# Patient Record
Sex: Female | Born: 1995 | Race: Black or African American | Hispanic: No | Marital: Single | Smoking: Never smoker
Health system: Southern US, Community
[De-identification: ages and names within clinical notes are randomized; demographics above are authoritative.]

## PROBLEM LIST (undated history)

## (undated) ENCOUNTER — Ambulatory Visit: Source: Home / Self Care

## (undated) DIAGNOSIS — J45909 Unspecified asthma, uncomplicated: Secondary | ICD-10-CM

## (undated) HISTORY — PX: HERNIA REPAIR: SHX51

---
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
## 2017-01-04 ENCOUNTER — Ambulatory Visit (HOSPITAL_COMMUNITY)
Admission: EM | Admit: 2017-01-04 | Discharge: 2017-01-04 | Disposition: A | Discharge status: Home / Self Care | Payer: Self-pay | Attending: Internal Medicine | Admitting: Internal Medicine

## 2017-01-04 ENCOUNTER — Encounter (HOSPITAL_COMMUNITY): Payer: Self-pay | Admitting: Emergency Medicine

## 2017-01-04 DIAGNOSIS — S025XXA Fracture of tooth (traumatic), initial encounter for closed fracture: Secondary | ICD-10-CM

## 2017-01-04 DIAGNOSIS — K047 Periapical abscess without sinus: Secondary | ICD-10-CM

## 2017-01-04 MED ORDER — PENICILLIN V POTASSIUM 500 MG PO TABS: 500.0000 mg | ORAL_TABLET | Freq: Two times a day (BID) | ORAL | 0 refills | Status: DC

## 2017-01-04 MED ORDER — HYDROCODONE-ACETAMINOPHEN 5-325 MG PO TABS: 1.0000 | ORAL_TABLET | Freq: Four times a day (QID) | ORAL | 0 refills | Status: DC | PRN

## 2017-01-04 MED ORDER — NAPROXEN 500 MG PO TABS: 500.0000 mg | ORAL_TABLET | Freq: Two times a day (BID) | ORAL | 0 refills | Status: DC

## 2017-01-04 MED ORDER — KETOROLAC TROMETHAMINE 60 MG/2ML IM SOLN
INTRAMUSCULAR | Status: AC
  Filled 2017-01-04: qty 2

## 2017-01-04 MED ORDER — KETOROLAC TROMETHAMINE 60 MG/2ML IM SOLN
60.0000 mg | Freq: Once | INTRAMUSCULAR | Status: AC
  Administered 2017-01-04: 60 mg via INTRAMUSCULAR

## 2017-01-04 NOTE — ED Provider Notes (Signed)
[facility]    CSN: [id number] Arrival date & time: 01/04/17  1406     History   Chief Complaint Chief Complaint  Patient presents with  . Dental Pain    HPI Barbara King is a 21 y.o. female.  She presents today with 2-3 day history of pain and swelling and a broken tooth in the left lower jaw. This is becoming increasingly severe over the last couple days. Tooth was known to be broken, but had not been inflamed previously. No fever, no malaise. Also has some difficulty with late day headaches most days, this is a chronic difficulty.    HPI  Past Medical History:  Diagnosis Date  . Asthma      Past Surgical History:  Procedure Laterality Date  . HERNIA REPAIR       Home Medications    Prior to Admission medications   Medication Sig Start Date End Date Taking? Authorizing Provider  albuterol (PROVENTIL HFA;VENTOLIN HFA) 108 (90 Base) MCG/ACT inhaler Inhale into the lungs every 6 (six) hours as needed for wheezing or shortness of breath.   Yes [provider]  acetaminophen (TYLENOL) 325 MG tablet Take 2 tablets (650 mg total) by mouth every 6 (six) hours as needed for mild pain. 01/29/15   Marcellina Millin, MD    Family History History reviewed. No pertinent family history.  Social History Social History  Substance Use Topics  . Smoking status: Never Smoker  . Smokeless tobacco: Never Used  . Alcohol use No     Allergies   Patient has no known allergies.   Review of Systems Review of Systems  All other systems reviewed and are negative.    Physical Exam Triage Vital Signs ED Triage Vitals  Enc Vitals Group     BP 01/04/17 1425 113/68     Pulse Rate 01/04/17 1425 96     Resp 01/04/17 1425 20     Temp 01/04/17 1425 98.9 F (37.2 C)     Temp Source 01/04/17 1425 Oral     SpO2 01/04/17 1425 100 %     Weight --      Height --      Pain Score 01/04/17 1424 8     Pain Loc --    Updated Vital Signs BP 113/68 (BP Location:  Right Arm)   Pulse 96   Temp 98.9 F (37.2 C) (Oral)   Resp 20   LMP 12/11/2016   SpO2 100%   Physical Exam  Constitutional: She is oriented to person, place, and time. No distress.  HENT:  Head: Atraumatic.  No face or jaw swelling visible externally Gum swelling and tenderness around the left lower second molar, which is broken No trismus  Eyes:  Conjugate gaze observed, no eye redness/discharge  Neck: Neck supple.  Cardiovascular: Normal rate.   Pulmonary/Chest: No respiratory distress.  Abdominal: She exhibits no distension.  Musculoskeletal: Normal range of motion.  Neurological: She is alert and oriented to person, place, and time.  Skin: Skin is warm and dry.  Nursing note and vitals reviewed.    UC Treatments / Results   Procedures Procedures (including critical care time)  Medications Ordered in UC Medications  ketorolac (TORADOL) injection 60 mg (60 mg Intramuscular Given 01/04/17 1504)    Final Clinical Impressions(s) / UC Diagnoses   Final diagnoses:  Dental infection  Closed broken tooth with complication, initial encounter   Followup with dentist as planned on Monday 5/21.  Injection of  ketorolac (anti inflammatory/pain reliever) given at urgent care today for pain.  Prescriptions for antibiotic (penicillin V) and naproxen (for pain) sent to the pharmacy.  Prescription for a small number of vicodin printed, for very severe pain.    Meds ordered this encounter  Medications  . HYDROcodone-acetaminophen (NORCO/VICODIN) 5-325 MG tablet    Sig: Take 1 tablet by mouth 4 (four) times daily as needed.    Dispense:  10 tablet    Refill:  0  . naproxen (NAPROSYN) 500 MG tablet    Sig: Take 1 tablet (500 mg total) by mouth 2 (two) times daily.    Dispense:  30 tablet    Refill:  0  . penicillin v potassium (VEETID) 500 MG tablet    Sig: Take 1 tablet (500 mg total) by mouth 2 (two) times daily.    Dispense:  20 tablet    Refill:  0      Eustace MooreMurray, [place]  W, MD 01/04/17 2142

## 2017-01-04 NOTE — Discharge Instructions (Addendum)
Followup with dentist as planned on Monday 5/21.  Injection of ketorolac (anti inflammatory/pain reliever) given at urgent care today for pain.  Prescriptions for antibiotic (penicillin V) and naproxen (for pain) sent to the pharmacy.  Prescription for a small number of vicodin printed, for very severe pain.

## 2017-01-04 NOTE — ED Triage Notes (Signed)
Pt c/o lower left dental pain onset 2-3 associated w/facial swelling, HA  Denies fevers, chills  Has appt w/dentist on 5/21  A&O x4... NAD.... Ambulatory

## 2019-03-03 ENCOUNTER — Encounter (HOSPITAL_COMMUNITY): Payer: Self-pay

## 2019-03-03 ENCOUNTER — Other Ambulatory Visit: Payer: Self-pay

## 2019-03-03 ENCOUNTER — Ambulatory Visit (HOSPITAL_COMMUNITY)
Admission: EM | Admit: 2019-03-03 | Discharge: 2019-03-03 | Disposition: A | Discharge status: Home / Self Care | Payer: Self-pay | Attending: Family Medicine | Admitting: Family Medicine

## 2019-03-03 DIAGNOSIS — N898 Other specified noninflammatory disorders of vagina: Secondary | ICD-10-CM | POA: Insufficient documentation

## 2019-03-03 LAB — POCT URINALYSIS DIP (DEVICE)
Glucose, UA: NEGATIVE mg/dL
Hgb urine dipstick: NEGATIVE
Ketones, ur: NEGATIVE mg/dL
Leukocytes,Ua: NEGATIVE
Nitrite: NEGATIVE
Protein, ur: NEGATIVE mg/dL
Specific Gravity, Urine: 1.025 (ref 1.005–1.030)
Urobilinogen, UA: 0.2 mg/dL (ref 0.0–1.0)
pH: 6.5 (ref 5.0–8.0)

## 2019-03-03 LAB — POCT PREGNANCY, URINE: Preg Test, Ur: NEGATIVE

## 2019-03-03 MED ORDER — METRONIDAZOLE 500 MG PO TABS: 500.0000 mg | ORAL_TABLET | Freq: Two times a day (BID) | ORAL | 0 refills | Status: DC

## 2019-03-03 MED ORDER — FLUCONAZOLE 150 MG PO TABS: 150.0000 mg | ORAL_TABLET | Freq: Every day | ORAL | 0 refills | Status: DC

## 2019-03-03 NOTE — Discharge Instructions (Addendum)
We are treating you for BV and trichomonas. Also treating for possible yeast Sending your swab for testing We will call you with any positive results. Refrain from sexual activity until known  results

## 2019-03-03 NOTE — ED Triage Notes (Signed)
Pt cc she has a vaginal discharge x 2 weeks. Pt states its yellowish and white.

## 2019-03-03 NOTE — ED Provider Notes (Signed)
[facility]    CSN: [id number] Arrival date & time: 03/03/19  0813     History   Chief Complaint Chief Complaint  Patient presents with  . Vaginal Discharge    HPI Barbara King is a 23 y.o. female.   Patient is a 23 year old female that presents today with approximately 2 weeks of vaginal discharge.  She is reporting the discharge is yellowish and white.  Some dysuria and irritation in the vaginal area more after intercourse.  Symptoms have been constant.  She did a test kit that she bought from [facility] that revealed results of possible bacterial vaginosis or trichomonas.  She is currently sexually active with one partner, unprotected.  Denies any associate abdominal pain, back pain, pelvic pain, fevers, hematuria or urinary frequency.  Denies any vaginal bleeding. Patient's last menstrual period was 02/03/2019.  ROS per HPI      Past Medical History:  Diagnosis Date  . Asthma     There are no active problems to display for this patient.   Past Surgical History:  Procedure Laterality Date  . HERNIA REPAIR      OB History   No obstetric history on file.      Home Medications    Prior to Admission medications   Medication Sig Start Date End Date Taking? Authorizing Provider  albuterol (PROVENTIL HFA;VENTOLIN HFA) 108 (90 Base) MCG/ACT inhaler Inhale into the lungs every 6 (six) hours as needed for wheezing or shortness of breath.    [provider]  fluconazole (DIFLUCAN) 150 MG tablet Take 1 tablet (150 mg total) by mouth daily. 03/03/19   Loura Halt A, NP  metroNIDAZOLE (FLAGYL) 500 MG tablet Take 1 tablet (500 mg total) by mouth 2 (two) times daily. 03/03/19   Orvan July, NP    Family History History reviewed. No pertinent family history.  Social History Social History   Tobacco Use  . Smoking status: Never Smoker  . Smokeless tobacco: Never Used  Substance Use Topics  . Alcohol use: No  . Drug use: No     Allergies    Patient has no known allergies.   Review of Systems Review of Systems   Physical Exam Triage Vital Signs ED Triage Vitals  Enc Vitals Group     BP 03/03/19 0837 107/80     Pulse --      Resp 03/03/19 0837 16     Temp 03/03/19 0837 97.8 F (36.6 C)     Temp Source 03/03/19 0837 Oral     SpO2 03/03/19 0837 100 %     Weight 03/03/19 0835 170 lb (77.1 kg)     Height --      Head Circumference --      Peak Flow --      Pain Score 03/03/19 0835 0     Pain Loc --      Pain Edu? --      Excl. in [place]? --    No data found.  Updated Vital Signs BP 107/80 (BP Location: Right Arm)   Temp 97.8 F (36.6 C) (Oral)   Resp 16   Wt 170 lb (77.1 kg)   LMP 02/03/2019   SpO2 100%   Visual Acuity Right Eye Distance:   Left Eye Distance:   Bilateral Distance:    Right Eye Near:   Left Eye Near:    Bilateral Near:     Physical Exam Vitals signs and nursing note reviewed.  Constitutional:  General: She is not in acute distress.    Appearance: Normal appearance. She is not ill-appearing, toxic-appearing or diaphoretic.  HENT:     Head: Normocephalic.     Nose: Nose normal.     Mouth/Throat:     Pharynx: Oropharynx is clear.  Eyes:     Conjunctiva/sclera: Conjunctivae normal.  Neck:     Musculoskeletal: Normal range of motion.  Pulmonary:     Effort: Pulmonary effort is normal.  Abdominal:     Palpations: Abdomen is soft.     Tenderness: There is no abdominal tenderness.  Musculoskeletal: Normal range of motion.  Skin:    General: Skin is warm and dry.     Findings: No rash.  Neurological:     Mental Status: She is alert.  Psychiatric:        Mood and Affect: Mood normal.      UC Treatments / Results  Labs (all labs ordered are listed, but only abnormal results are displayed) Labs Reviewed  POCT URINALYSIS DIP (DEVICE) - Abnormal; Notable for the following components:      Result Value   Bilirubin Urine SMALL (*)    All other components within normal  limits  POC URINE PREG, ED  POCT PREGNANCY, URINE  CERVICOVAGINAL ANCILLARY ONLY    EKG   Radiology No results found.  Procedures Procedures (including critical care time)  Medications Ordered in UC Medications - No data to display  Initial Impression / Assessment and Plan / UC Course  I have reviewed the triage vital signs and the nursing notes.  Pertinent labs & imaging results that were available during my care of the patient were reviewed by me and considered in my medical decision making (see chart for details).     Urine negative for infection or pregnancy. Sending self swab for testing Treating prophylactically for BV, yeast and trichomonas. Recommend refrain from sexual activity for at least 7 days until we get results and ensure the infection is cleared Final Clinical Impressions(s) / UC Diagnoses   Final diagnoses:  Vaginal discharge     Discharge Instructions     We are treating you for BV and trichomonas. Also treating for possible yeast Sending your swab for testing We will call you with any positive results. Refrain from sexual activity until known  results    ED Prescriptions    Medication Sig Dispense Auth. Provider   metroNIDAZOLE (FLAGYL) 500 MG tablet Take 1 tablet (500 mg total) by mouth 2 (two) times daily. 14 tablet Ceara Wrightson A, NP   fluconazole (DIFLUCAN) 150 MG tablet Take 1 tablet (150 mg total) by mouth daily. 2 tablet Loura Halt A, NP     Controlled Substance Prescriptions [place] Controlled Substance Registry consulted? Not Applicable   Orvan July, NP 03/03/19 0945

## 2019-03-04 LAB — CERVICOVAGINAL ANCILLARY ONLY
Bacterial vaginitis: POSITIVE — AB
Candida vaginitis: POSITIVE — AB
Chlamydia: NEGATIVE
Neisseria Gonorrhea: NEGATIVE
Trichomonas: NEGATIVE

## 2019-05-16 ENCOUNTER — Emergency Department (HOSPITAL_COMMUNITY)
Admission: EM | Admit: 2019-05-16 | Discharge: 2019-05-16 | Disposition: A | Discharge status: Home / Self Care | Payer: Self-pay | Attending: Emergency Medicine | Admitting: Emergency Medicine

## 2019-05-16 ENCOUNTER — Other Ambulatory Visit: Payer: Self-pay

## 2019-05-16 ENCOUNTER — Encounter (HOSPITAL_COMMUNITY): Payer: Self-pay | Admitting: Emergency Medicine

## 2019-05-16 DIAGNOSIS — Z5321 Procedure and treatment not carried out due to patient leaving prior to being seen by health care provider: Secondary | ICD-10-CM | POA: Insufficient documentation

## 2019-05-16 DIAGNOSIS — H9202 Otalgia, left ear: Secondary | ICD-10-CM | POA: Insufficient documentation

## 2019-05-16 NOTE — ED Notes (Signed)
Patient states she wants to leave because the wait time is too long. States she is going to go to urgent care in morning.

## 2019-05-16 NOTE — ED Triage Notes (Signed)
Pt c/o L sided ear pain, R sided HA with shooting pain when sneezing and increased pressure when she bends over while working.

## 2020-07-06 ENCOUNTER — Other Ambulatory Visit: Payer: Self-pay

## 2020-07-06 ENCOUNTER — Other Ambulatory Visit: Payer: Self-pay | Admitting: Family Medicine

## 2020-07-06 ENCOUNTER — Ambulatory Visit: Payer: Self-pay

## 2020-07-06 DIAGNOSIS — Z0000 Encounter for general adult medical examination without abnormal findings: Secondary | ICD-10-CM

## 2021-05-30 ENCOUNTER — Encounter: Payer: 59 | Attending: Obstetrics and Gynecology | Admitting: Registered"

## 2021-05-30 ENCOUNTER — Ambulatory Visit: Payer: 59 | Admitting: Registered"

## 2021-05-30 ENCOUNTER — Other Ambulatory Visit: Payer: Self-pay

## 2021-05-30 DIAGNOSIS — R7309 Other abnormal glucose: Secondary | ICD-10-CM | POA: Diagnosis present

## 2021-05-31 ENCOUNTER — Ambulatory Visit: Payer: 59 | Admitting: Registered"

## 2021-06-01 ENCOUNTER — Ambulatory Visit: Payer: 59 | Admitting: Registered"

## 2021-06-01 ENCOUNTER — Encounter: Payer: Self-pay | Admitting: Registered"

## 2021-06-01 NOTE — Progress Notes (Signed)
Patient was seen for (elevated A1c in pregnancy) self-management on 05/30/2021  Start time 0915 and End time 1015   Estimated due date: 01/01/2021; [redacted]w[redacted]d  Clinical: Medications: none Medical History: reviewed Labs: OGTT n/a, A1c 5.7%   Dietary and Lifestyle History: Patient states recently she has been able to get better sleep since she stopped working. Pt states prior to that she was working 12 hr days. Pt states she had an active job but now that she is not working does not get much activity and feels tired so not motivated to exercise.  Pt states she has stopped drinking coffee ([employer]bucks), sweet tea and soda.  Physical Activity: ADL Stress: 2/10 Sleep: 6-8 hrs, restless  24 hr Recall: First Meal: eggs, corn beef hash OR oatmeal, strawberries and cream OR mcdonalds sausage, egg, cheese biscuit, hasbrowns OR cereal Snack: Second meal: [employer] chicken nuggests 10 piece, medium fries Snack: Third meal:none Snacks throughout the day may include chewy granola bars, poptarts Beverages: water, OM breakfast   NUTRITION INTERVENTION  Nutrition education (E-1) on the following topics:   Initial Follow-up  [x]  []  Definition of Gestational Diabetes   ** and reasons for testing prior to 3rd trimester [x]  []  Why dietary management is important in controlling blood glucose []  []  Effects each nutrient has on blood glucose levels []  []  Simple carbohydrates vs complex carbohydrates []  []  Fluid intake []  []  Creating a balanced meal plan []  []  Carbohydrate counting  [x]  []  When to check blood glucose levels [x]  []  Proper blood glucose monitoring techniques [x]  []  Effect of stress and stress reduction techniques  [x]  []  Exercise effect on blood glucose levels, appropriate exercise during pregnancy []  []  Importance of limiting caffeine and abstaining from alcohol and smoking []  []  Medications used for blood sugar control during pregnancy []  []  Hypoglycemia and rule of  15 []  []  Postpartum self care  Blood glucose monitor given: Accu-chek Guide Me Lot Exp: 06/27/2022 CBG: 102 mg/dL  Patient instructed to monitor glucose levels: FBS: 60 - ? 95 mg/dL (some clinics use 90 for cutoff) 1 hour: ? 140 mg/dL 2 hour: ? mg/dL  Patient received handouts: Nutrition Diabetes and Pregnancy Carbohydrate Counting List  Patient will be seen for follow-up as needed.

## 2021-06-01 NOTE — Progress Notes (Signed)
Patient was seen for Gestational Diabetes self-management on 05/30/2021  Start time 0915 and End time 1015   Patient was seen at [facility] for women location, but appointment created in the Nutrition [facility] Diabetes [facility] schedule.   Visit Notes and letter to referring provider in [facility] schedule

## 2021-06-14 ENCOUNTER — Encounter: Payer: Self-pay | Admitting: Obstetrics and Gynecology

## 2021-07-09 ENCOUNTER — Encounter: Payer: 59 | Attending: Obstetrics and Gynecology | Admitting: Registered"

## 2021-07-09 DIAGNOSIS — R7309 Other abnormal glucose: Secondary | ICD-10-CM | POA: Insufficient documentation

## 2021-08-19 NOTE — L&D Delivery Note (Signed)
Delivery Note ?I was called to stand by for this delivery as the on call attending was not immediately available.  On evaluation, patient was noted to be pushing and fetal head was crowning. ? ?At 11:58 AM a viable female was delivered via Vaginal, Spontaneous (Presentation: LOA).  APGAR: 9, 9; weight pending.  ?Placenta status: Spontaneous, Intact.  Cord:  3VC  with the following complications: Tight nuchal cord x 1, loose body cord x 1. ? ?Anesthesia:  Epidural ?Episiotomy:  Not done ?Lacerations: Bilateral superficial lacerations of labia minora re-approximated with interrupted stitches of 3-0 Chromic ?Est. Blood Loss (mL):  75 ml ? ?Mom to postpartum.  Baby to Couplet care / Skin to Skin. ? ? ?Jaynie Collins, MD ?12/21/2021, 12:23 PM ?

## 2021-09-13 ENCOUNTER — Inpatient Hospital Stay (HOSPITAL_COMMUNITY)
Admission: AD | Admit: 2021-09-13 | Discharge: 2021-09-13 | Disposition: A | Discharge status: Home / Self Care | Payer: 59 | Attending: Obstetrics & Gynecology | Admitting: Obstetrics & Gynecology

## 2021-09-13 ENCOUNTER — Encounter (HOSPITAL_COMMUNITY): Payer: Self-pay

## 2021-09-13 DIAGNOSIS — Z0371 Encounter for suspected problem with amniotic cavity and membrane ruled out: Secondary | ICD-10-CM | POA: Diagnosis not present

## 2021-09-13 DIAGNOSIS — Z3689 Encounter for other specified antenatal screening: Secondary | ICD-10-CM | POA: Insufficient documentation

## 2021-09-13 DIAGNOSIS — N898 Other specified noninflammatory disorders of vagina: Secondary | ICD-10-CM | POA: Insufficient documentation

## 2021-09-13 DIAGNOSIS — O368120 Decreased fetal movements, second trimester, not applicable or unspecified: Secondary | ICD-10-CM | POA: Insufficient documentation

## 2021-09-13 DIAGNOSIS — Z3A24 24 weeks gestation of pregnancy: Secondary | ICD-10-CM | POA: Diagnosis not present

## 2021-09-13 DIAGNOSIS — O26892 Other specified pregnancy related conditions, second trimester: Secondary | ICD-10-CM | POA: Insufficient documentation

## 2021-09-13 LAB — WET PREP, GENITAL
Clue Cells Wet Prep HPF POC: NONE SEEN
Sperm: NONE SEEN
Trich, Wet Prep: NONE SEEN
WBC, Wet Prep HPF POC: >=10 — AB (ref <10)
Yeast Wet Prep HPF POC: NONE SEEN

## 2021-09-13 LAB — URINALYSIS, ROUTINE W REFLEX MICROSCOPIC
Bilirubin Urine: NEGATIVE
Glucose, UA: NEGATIVE mg/dL
Hgb urine dipstick: NEGATIVE
Ketones, ur: NEGATIVE mg/dL
Leukocytes,Ua: NEGATIVE
Nitrite: NEGATIVE
Protein, ur: NEGATIVE mg/dL
Specific Gravity, Urine: 1.005 (ref 1.005–1.030)
pH: 7 (ref 5.0–8.0)

## 2021-09-13 LAB — AMNISURE RUPTURE OF MEMBRANE (ROM) NOT AT ARMC: Amnisure ROM: NEGATIVE

## 2021-09-13 LAB — POCT FERN TEST: POCT Fern Test: NEGATIVE

## 2021-09-13 NOTE — Discharge Instructions (Signed)

## 2021-09-13 NOTE — MAU Provider Note (Signed)
History     CSN: [id number]  Arrival date and time: 09/13/21 1408   Event Date/Time   First Provider Initiated Contact with Patient 09/13/21 1438      Chief Complaint  Patient presents with   Decreased Fetal Movement   Rupture of Membranes   HPI Barbara King is a 26 y.o. G1P0 at [redacted]w[redacted]d who presents with decreased fetal movement. She states the baby wasn't moving as much this morning so she tried to listen with her doppler but wasn't able to find the heartbeat and she got scared. She also reports some leaking of fluid while she was at work and is unsure if it's urine. She denies any pain.  OB History     Gravida  1   Para      Term      Preterm      AB      Living         SAB      IAB      Ectopic      Multiple      Live Births              Past Medical History:  Diagnosis Date   Asthma     Past Surgical History:  Procedure Laterality Date   HERNIA REPAIR      History reviewed. No pertinent family history.  Social History   Tobacco Use   Smoking status: Never   Smokeless tobacco: Never  Substance Use Topics   Alcohol use: No   Drug use: No    Allergies: No Known Allergies  Medications Prior to Admission  Medication Sig Dispense Refill Last Dose   albuterol (PROVENTIL HFA;VENTOLIN HFA) 108 (90 Base) MCG/ACT inhaler Inhale into the lungs every 6 (six) hours as needed for wheezing or shortness of breath.   Past Week   Prenatal Vit-Fe Fumarate-FA (PRENATAL MULTIVITAMIN) TABS tablet Take 1 tablet by mouth daily at 12 noon.   09/13/2021   fluconazole (DIFLUCAN) 150 MG tablet Take 1 tablet (150 mg total) by mouth daily. 2 tablet 0    metroNIDAZOLE (FLAGYL) 500 MG tablet Take 1 tablet (500 mg total) by mouth 2 (two) times daily. 14 tablet 0     Review of Systems  Constitutional: Negative.  Negative for fatigue and fever.  HENT: Negative.    Respiratory: Negative.  Negative for shortness of breath.   Cardiovascular: Negative.  Negative for  chest pain.  Gastrointestinal: Negative.  Negative for abdominal pain, constipation, diarrhea, nausea and vomiting.  Genitourinary:  Positive for vaginal discharge. Negative for dysuria and vaginal bleeding.  Neurological: Negative.  Negative for dizziness and headaches.  Physical Exam   Blood pressure 119/77, temperature 98.6 F (37 C), temperature source Oral, resp. rate 15, SpO2 98 %.  Physical Exam Vitals and nursing note reviewed.  Constitutional:      General: She is not in acute distress.    Appearance: She is well-developed.  HENT:     Head: Normocephalic.  Eyes:     Pupils: Pupils are equal, round, and reactive to light.  Cardiovascular:     Rate and Rhythm: Normal rate and regular rhythm.     Heart sounds: Normal heart sounds.  Pulmonary:     Effort: Pulmonary effort is normal. No respiratory distress.     Breath sounds: Normal breath sounds.  Abdominal:     General: Bowel sounds are normal. There is no distension.     Palpations: Abdomen is soft.  Tenderness: There is no abdominal tenderness.  Genitourinary:    Comments: Pelvic exam: Cervix pink, visually closed, without lesion, scant white creamy discharge, vaginal walls and external genitalia normal   Skin:    General: Skin is warm and dry.  Neurological:     Mental Status: She is alert and oriented to person, place, and time.  Psychiatric:        Mood and Affect: Mood normal.        Behavior: Behavior normal.        Thought Content: Thought content normal.        Judgment: Judgment normal.   Fetal Tracing:  Baseline: 140 Variability: moderate Accels: 10x10 Decels: none  Toco: none   MAU Course  Procedures Results for orders placed or performed during the hospital encounter of 09/13/21 (from the past 24 hour(s))  Urinalysis, Routine w reflex microscopic Urine, Clean Catch     Status: Abnormal   Collection Time: 09/13/21  2:42 PM  Result Value Ref Range   Color, Urine YELLOW YELLOW    APPearance HAZY (A) CLEAR   Specific Gravity, Urine 1.005 1.005 - 1.030   pH 7.0 5.0 - 8.0   Glucose, UA NEGATIVE NEGATIVE mg/dL   Hgb urine dipstick NEGATIVE NEGATIVE   Bilirubin Urine NEGATIVE NEGATIVE   Ketones, ur NEGATIVE NEGATIVE mg/dL   Protein, ur NEGATIVE NEGATIVE mg/dL   Nitrite NEGATIVE NEGATIVE   Leukocytes,Ua NEGATIVE NEGATIVE  Wet prep, genital     Status: Abnormal   Collection Time: 09/13/21  2:58 PM  Result Value Ref Range   Yeast Wet Prep HPF POC NONE SEEN NONE SEEN   Trich, Wet Prep NONE SEEN NONE SEEN   Clue Cells Wet Prep HPF POC NONE SEEN NONE SEEN   WBC, Wet Prep HPF POC >=10 (A) <10   Sperm NONE SEEN   Fern Test     Status: None   Collection Time: 09/13/21  3:16 PM  Result Value Ref Range   POCT Fern Test Negative = intact amniotic membranes   Amnisure rupture of membrane (rom)not at [facility]     Status: None   Collection Time: 09/13/21  3:22 PM  Result Value Ref Range   Amnisure ROM NEGATIVE     MDM UA Wet prep and gc/chlamydia Amnisure  Reassurance provided of expectations for fetal movement at this gestation. Discussed how anterior placentas can impact perceived fetal movement.  Assessment and Plan   1. Encounter for suspected premature rupture of amniotic membranes, with rupture of membranes not found   2. NST (non-stress test) reactive   3. [redacted] weeks gestation of pregnancy    -Discharge home in stable condition -Fetal movement precautions discussed -Patient advised to follow-up with OB as scheduled for prenatal care -Patient may return to MAU as needed or if her condition were to change or worsen   Rolm Bookbinder CNM 09/13/2021, 2:38 PM

## 2021-09-13 NOTE — MAU Note (Signed)
.  Barbara King is a 26 y.o. at [redacted]w[redacted]d here in MAU reporting: DFM since 0500 this morning. States she tried to listen with the doppler and couldn't find FHR and got scared. States she also noticed clear watery discharge that started this morning around 0200 while she was at work. Denies VB. Having lower abdominal cramping.   Pain score: 5   FHT:144 Lab orders placed from triage:  UA

## 2021-09-14 LAB — GC/CHLAMYDIA PROBE AMP (~~LOC~~) NOT AT ARMC
Chlamydia: NEGATIVE
Comment: NEGATIVE
Comment: NORMAL
Neisseria Gonorrhea: NEGATIVE

## 2021-10-15 ENCOUNTER — Other Ambulatory Visit: Payer: Self-pay

## 2021-10-15 ENCOUNTER — Inpatient Hospital Stay (HOSPITAL_COMMUNITY)
Admission: AD | Admit: 2021-10-15 | Discharge: 2021-10-15 | Disposition: A | Discharge status: Home / Self Care | Payer: 59 | Attending: Obstetrics & Gynecology | Admitting: Obstetrics & Gynecology

## 2021-10-15 ENCOUNTER — Encounter (HOSPITAL_COMMUNITY): Payer: Self-pay | Admitting: Obstetrics & Gynecology

## 2021-10-15 DIAGNOSIS — O368130 Decreased fetal movements, third trimester, not applicable or unspecified: Secondary | ICD-10-CM | POA: Diagnosis present

## 2021-10-15 DIAGNOSIS — O368120 Decreased fetal movements, second trimester, not applicable or unspecified: Secondary | ICD-10-CM | POA: Diagnosis not present

## 2021-10-15 DIAGNOSIS — Z3689 Encounter for other specified antenatal screening: Secondary | ICD-10-CM

## 2021-10-15 DIAGNOSIS — Z3A28 28 weeks gestation of pregnancy: Secondary | ICD-10-CM | POA: Insufficient documentation

## 2021-10-15 NOTE — MAU Provider Note (Signed)
History     CSN: [id number]  Arrival date and time: 10/15/21 1536   Event Date/Time   First Provider Initiated Contact with Patient 10/15/21 1603      Chief Complaint  Patient presents with   Decreased Fetal Movement   HPI Barbara King is a 26 y.o. G1P0 at [redacted]w[redacted]d who presents to MAU with chief complaint of absent fetal movement. Patient states she has not felt the baby move since last night 10/14/2021. Patient states she has attempted to facilitate fetal movement by eating, drinking something cold, and lying on her side. She continues to be unable to detect fetal movement on CNM initial assessment. She denies pain, vaginal bleeding, leaking of fluid, fever, falls, or recent illness.    OB History     Gravida  1   Para      Term      Preterm      AB      Living         SAB      IAB      Ectopic      Multiple      Live Births              Past Medical History:  Diagnosis Date   Asthma     Past Surgical History:  Procedure Laterality Date   HERNIA REPAIR      History reviewed. No pertinent family history.  Social History   Tobacco Use   Smoking status: Never   Smokeless tobacco: Never  Substance Use Topics   Alcohol use: No   Drug use: No    Allergies: No Known Allergies  Medications Prior to Admission  Medication Sig Dispense Refill Last Dose   albuterol (PROVENTIL HFA;VENTOLIN HFA) 108 (90 Base) MCG/ACT inhaler Inhale into the lungs every 6 (six) hours as needed for wheezing or shortness of breath.   Past Month   aspirin 81 MG chewable tablet Chew by mouth daily.   10/13/2021   ferrous sulfate 324 MG TBEC Take 324 mg by mouth.   10/15/2021   Prenatal Vit-Fe Fumarate-FA (PRENATAL MULTIVITAMIN) TABS tablet Take 1 tablet by mouth daily at 12 noon.   10/15/2021    Review of Systems  All other systems reviewed and are negative. Physical Exam   Blood pressure 120/72, pulse (!) 105, temperature 98.3 F (36.8 C), resp. rate (!) 105, SpO2 98  %.  Physical Exam Vitals and nursing note reviewed. Exam conducted with a chaperone present.  Constitutional:      Appearance: Normal appearance.  Cardiovascular:     Rate and Rhythm: Normal rate and regular rhythm.     Pulses: Normal pulses.     Heart sounds: Normal heart sounds.  Pulmonary:     Effort: Pulmonary effort is normal.  Abdominal:     Comments: Gravid  Skin:    Capillary Refill: Capillary refill takes less than 2 seconds.  Neurological:     Mental Status: She is alert.    MAU Course  Procedures  MDM --Reactive tracing: baseline 145, mod var, + accels, no decels --Toco: quiet --No concerning events during one hour of continuous monitoring --Patient has pushed NST button signaling fetal movement about 18 times during one hour  Patient Vitals for the past 24 hrs:  BP Temp Pulse Resp SpO2  10/15/21 1659 114/73 -- 99 17 100 %  10/15/21 1650 -- -- -- -- 99 %  10/15/21 1645 -- -- -- -- 98 %  10/15/21  1640 -- -- -- -- 98 %  10/15/21 1635 -- -- -- -- 98 %  10/15/21 1630 -- -- -- -- 98 %  10/15/21 1625 -- -- -- -- 99 %  10/15/21 1620 -- -- -- -- 99 %  10/15/21 1615 -- -- -- -- 99 %  10/15/21 1610 -- -- -- -- 99 %  10/15/21 1605 -- -- -- -- 98 %  10/15/21 1600 -- -- -- -- 99 %  10/15/21 1549 120/72 98.3 F (36.8 C) (!) 105 (!) 105 --   Assessment and Plan  --26 y.o. G1P0 at [redacted]w[redacted]d  --Reactive tracing --Patient endorses fetal movement  --Discharge home in stable condition  Calvert Cantor, CNM 10/15/2021, 6:09 PM

## 2021-10-15 NOTE — MAU Note (Signed)
Pt reports she has not felt baby move since last night. Nervous something is wrong. Pt was told she had an anterior placenta. Denies any pain or cramping. No bleeding or leaking reported.

## 2021-10-25 ENCOUNTER — Encounter (HOSPITAL_COMMUNITY): Payer: Self-pay | Admitting: Obstetrics & Gynecology

## 2021-10-25 ENCOUNTER — Inpatient Hospital Stay (HOSPITAL_COMMUNITY)
Admission: AD | Admit: 2021-10-25 | Discharge: 2021-10-25 | Disposition: A | Discharge status: Home / Self Care | Payer: 59 | Attending: Obstetrics & Gynecology | Admitting: Obstetrics & Gynecology

## 2021-10-25 DIAGNOSIS — O4703 False labor before 37 completed weeks of gestation, third trimester: Secondary | ICD-10-CM | POA: Insufficient documentation

## 2021-10-25 DIAGNOSIS — O479 False labor, unspecified: Secondary | ICD-10-CM

## 2021-10-25 DIAGNOSIS — Z3A30 30 weeks gestation of pregnancy: Secondary | ICD-10-CM | POA: Diagnosis not present

## 2021-10-25 DIAGNOSIS — R109 Unspecified abdominal pain: Secondary | ICD-10-CM | POA: Diagnosis present

## 2021-10-25 DIAGNOSIS — Z7982 Long term (current) use of aspirin: Secondary | ICD-10-CM | POA: Diagnosis not present

## 2021-10-25 LAB — URINALYSIS, ROUTINE W REFLEX MICROSCOPIC
Bilirubin Urine: NEGATIVE
Glucose, UA: NEGATIVE mg/dL
Hgb urine dipstick: NEGATIVE
Ketones, ur: NEGATIVE mg/dL
Leukocytes,Ua: NEGATIVE
Nitrite: NEGATIVE
Protein, ur: NEGATIVE mg/dL
Specific Gravity, Urine: 1.024 (ref 1.005–1.030)
pH: 5 (ref 5.0–8.0)

## 2021-10-25 NOTE — MAU Provider Note (Signed)
?History  ?  ? ?[id number] ? ?Arrival date and time: 10/25/21 1259 ?  ? ?Chief Complaint  ?Patient presents with  ? Abdominal Pain  ? ? ? ?HPI ?Barbara King is a 26 y.o. at [redacted]w[redacted]d who presents for abdominal pain. Reports 2 episodes of abdominal tightening that occurred at work today.  Also felt tightening once yesterday. Has occasional sharp pains, specifically when changing positions in bed. Denies n/v/d, dysuria, vaginal bleeding, or LOF. Reports normal fetal movement.  ? ?OB History   ? ? Gravida  ?1  ? Para  ?   ? Term  ?   ? Preterm  ?   ? AB  ?   ? Living  ?   ?  ? ? SAB  ?   ? IAB  ?   ? Ectopic  ?   ? Multiple  ?   ? Live Births  ?   ?   ?  ?  ? ? ?Past Medical History:  ?Diagnosis Date  ? Asthma   ? ? ?Past Surgical History:  ?Procedure Laterality Date  ? HERNIA REPAIR    ? ? ?History reviewed. No pertinent family history. ? ?No Known Allergies ? ?No current facility-administered medications on file prior to encounter.  ? ?Current Outpatient Medications on File Prior to Encounter  ?Medication Sig Dispense Refill  ? albuterol (PROVENTIL HFA;VENTOLIN HFA) 108 (90 Base) MCG/ACT inhaler Inhale into the lungs every 6 (six) hours as needed for wheezing or shortness of breath.    ? aspirin 81 MG chewable tablet Chew by mouth daily.    ? ferrous sulfate 324 MG TBEC Take 324 mg by mouth.    ? Prenatal Vit-Fe Fumarate-FA (PRENATAL MULTIVITAMIN) TABS tablet Take 1 tablet by mouth daily at 12 noon.    ? ? ? ?ROS ?Pertinent positives and negative per HPI, all others reviewed and negative ? ?Physical Exam  ? ?BP 108/70 (BP Location: Right Arm)   Pulse 91   Temp 98 ?F (36.7 ?C) (Oral)   Resp 16   SpO2 100%  ? ?Patient Vitals for the past 24 hrs: ? BP Temp Temp src Pulse Resp SpO2  ?10/25/21 1451 108/70 98 ?F (36.7 ?C) Oral 91 16 --  ?10/25/21 1336 116/67 97.9 ?F (36.6 ?C) Oral (!) 102 16 100 %  ?10/25/21 1300 117/71 98.7 ?F (37.1 ?C) Oral 97 16 100 %  ? ? ?Physical Exam ?Vitals and nursing note reviewed. Exam  conducted with a chaperone present.  ?Constitutional:   ?   General: She is not in acute distress. ?   Appearance: She is well-developed. She is not toxic-appearing.  ?HENT:  ?   Head: Normocephalic and atraumatic.  ?Pulmonary:  ?   Effort: Pulmonary effort is normal. No respiratory distress.  ?Skin: ?   General: Skin is warm and dry.  ?Neurological:  ?   Mental Status: She is alert.  ?  ? ?Cervical Exam ?Dilation: Closed ?Effacement (%): Thick ?Cervical Position: Posterior ?Station: -3 ?Exam by:: Judeth Horn NP ? ? ?FHT ?Baseline 145, moderate variability, 15x15 accels, no decels ?Toco: none ?Cat: 1 ? ?Labs ?Results for orders placed or performed during the hospital encounter of 10/25/21 (from the past 24 hour(s))  ?Urinalysis, Routine w reflex microscopic Urine, Clean Catch     Status: Abnormal  ? Collection Time: 10/25/21  2:06 PM  ?Result Value Ref Range  ? Color, Urine YELLOW YELLOW  ? APPearance HAZY (A) CLEAR  ? Specific Gravity, Urine 1.024  1.005 - 1.030  ? pH 5.0 5.0 - 8.0  ? Glucose, UA NEGATIVE NEGATIVE mg/dL  ? Hgb urine dipstick NEGATIVE NEGATIVE  ? Bilirubin Urine NEGATIVE NEGATIVE  ? Ketones, ur NEGATIVE NEGATIVE mg/dL  ? Protein, ur NEGATIVE NEGATIVE mg/dL  ? Nitrite NEGATIVE NEGATIVE  ? Leukocytes,Ua NEGATIVE NEGATIVE  ? ? ?Imaging ?No results found. ? ?MAU Course  ?Procedures ?Lab Orders    ?     Urinalysis, Routine w reflex microscopic Urine, Clean Catch    ?No orders of the defined types were placed in this encounter. ? ?Imaging Orders  ?No imaging studies ordered today  ? ? ?MDM ?Presents with irregular abdominal tightening. No abdominal pain. No other ob complaints. Reactive fetal tracing. Cervix closed/thick. Negative urinalysis.  ?Assessment and Plan  ? ?1. Braxton Hicks contractions   ?2. [redacted] weeks gestation of pregnancy   ? ?-reviewed preterm labor precautions & reasons to return to MAU ? ?Judeth Horn, NP ?10/25/21 ?2:56 PM ? ? ?

## 2021-10-25 NOTE — MAU Note (Signed)
Barbara King is a 26 y.o. at [redacted]w[redacted]d here in MAU reporting: started 2 days ago, having tightening in abd, comes and goes.  Was "non-stop" at work today- but she was doing a lot of walking. When she is laying down and rolls over, she will get a sharp pain. Denies hx of PTL. No bleeding or LOF.  Reports +FM.  ? ?Onset of complaint: 2days ago, off and on ?Pain score: 3/10 ?Vitals:  ? 10/25/21 1300  ?BP: 117/71  ?Pulse: 97  ?Resp: 16  ?Temp: 98.7 ?F (37.1 ?C)  ?SpO2: 100%  ?   ?FHT:140 ?Lab orders placed from triage:  urine ?

## 2021-12-04 LAB — OB RESULTS CONSOLE GBS: GBS: NEGATIVE

## 2021-12-07 ENCOUNTER — Encounter (HOSPITAL_COMMUNITY): Payer: Self-pay | Admitting: Obstetrics & Gynecology

## 2021-12-07 ENCOUNTER — Other Ambulatory Visit: Payer: Self-pay

## 2021-12-07 ENCOUNTER — Inpatient Hospital Stay (HOSPITAL_COMMUNITY)
Admission: AD | Admit: 2021-12-07 | Discharge: 2021-12-07 | Disposition: A | Discharge status: Home / Self Care | Payer: 59 | Attending: Obstetrics & Gynecology | Admitting: Obstetrics & Gynecology

## 2021-12-07 DIAGNOSIS — Z3689 Encounter for other specified antenatal screening: Secondary | ICD-10-CM

## 2021-12-07 DIAGNOSIS — O26893 Other specified pregnancy related conditions, third trimester: Secondary | ICD-10-CM | POA: Insufficient documentation

## 2021-12-07 DIAGNOSIS — O26899 Other specified pregnancy related conditions, unspecified trimester: Secondary | ICD-10-CM

## 2021-12-07 DIAGNOSIS — Z3A36 36 weeks gestation of pregnancy: Secondary | ICD-10-CM | POA: Insufficient documentation

## 2021-12-07 LAB — URINALYSIS, ROUTINE W REFLEX MICROSCOPIC
Bilirubin Urine: NEGATIVE
Glucose, UA: NEGATIVE mg/dL
Hgb urine dipstick: NEGATIVE
Ketones, ur: NEGATIVE mg/dL
Leukocytes,Ua: NEGATIVE
Nitrite: NEGATIVE
Protein, ur: NEGATIVE mg/dL
Specific Gravity, Urine: 1.009 (ref 1.005–1.030)
pH: 6 (ref 5.0–8.0)

## 2021-12-07 NOTE — MAU Provider Note (Signed)
?History  ?  ? ?CSN: [id number] ? ?Arrival date and time: 12/07/21 1423 ? ? Event Date/Time  ? First Provider Initiated Contact with Patient 12/07/21 1529   ?  ? ?Chief Complaint  ?Patient presents with  ? Decreased Fetal Movement  ? vaginal pressure  ? Abdominal Pain  ? ?[name] is a 26 y.o. year old G1P0 female at [redacted]w[redacted]d weeks gestation who presents to MAU reporting no FM since 0600 and constant vaginal pressure x 1 week that increases with walking and constant abdominal tightness. She denies VB or LOF. She also complains of loose stools x 1 week; 7 stools/day. She receives [facility] with [facility]; next appt is 12/12/2021. Her FOB is present and contributing to the history taking. ? ? ?OB History   ? ? Gravida  ?1  ? Para  ?   ? Term  ?   ? Preterm  ?   ? AB  ?   ? Living  ?   ?  ? ? SAB  ?   ? IAB  ?   ? Ectopic  ?   ? Multiple  ?   ? Live Births  ?   ?   ?  ?  ? ? ?Past Medical History:  ?Diagnosis Date  ? Asthma   ? ? ?Past Surgical History:  ?Procedure Laterality Date  ? HERNIA REPAIR    ? ? ?History reviewed. No pertinent family history. ? ?Social History  ? ?Tobacco Use  ? Smoking status: Never  ? Smokeless tobacco: Never  ?Vaping Use  ? Vaping Use: Never used  ?Substance Use Topics  ? Alcohol use: No  ? Drug use: No  ? ? ?Allergies: No Known Allergies ? ?Medications Prior to Admission  ?Medication Sig Dispense Refill Last Dose  ? Prenatal Vit-Fe Fumarate-FA (PRENATAL MULTIVITAMIN) TABS tablet Take 1 tablet by mouth daily at 12 noon.   12/07/2021  ? albuterol (PROVENTIL HFA;VENTOLIN HFA) 108 (90 Base) MCG/ACT inhaler Inhale into the lungs every 6 (six) hours as needed for wheezing or shortness of breath.     ? aspirin 81 MG chewable tablet Chew by mouth daily.     ? ferrous sulfate 324 MG TBEC Take 324 mg by mouth.     ? ? ?Review of Systems  ?Constitutional: Negative.   ?HENT: Negative.    ?Eyes: Negative.   ?Respiratory: Negative.    ?Cardiovascular: Negative.   ?Gastrointestinal:  Negative.   ?Endocrine: Negative.   ?Genitourinary:   ?     DFM since 0600 this morning  ?Musculoskeletal: Negative.   ?Skin: Negative.   ?Allergic/Immunologic: Negative.   ?Neurological: Negative.   ?Hematological: Negative.   ?Psychiatric/Behavioral: Negative.    ?Physical Exam  ? ?Blood pressure 107/69, pulse (!) 103, temperature 98.9 ?F (37.2 ?C), temperature source Oral, resp. rate 15, SpO2 98 %. ? ?Physical Exam ?Vitals and nursing note reviewed.  ?Constitutional:   ?   Appearance: Normal appearance. She is obese.  ?Cardiovascular:  ?   Rate and Rhythm: Tachycardia present.  ?Pulmonary:  ?   Effort: Pulmonary effort is normal.  ?Genitourinary: ?   Comments: deferred ?Musculoskeletal:     ?   General: Normal range of motion.  ?Skin: ?   General: Skin is warm and dry.  ?Neurological:  ?   Mental Status: She is alert and oriented to person, place, and time.  ?Psychiatric:     ?   Mood and Affect: Mood normal.     ?  Behavior: Behavior normal.     ?   Thought Content: Thought content normal.     ?   Judgment: Judgment normal.  ? ?REACTIVE NST - FHR: 140 bpm / moderate variability / accels present / decels absent / TOCO: irregular UC's with UI noted ?  ?MAU Course  ?Procedures ? ?MDM ?CEFM ? ?Assessment and Plan  ?NST (non-stress test) reactive  ?- Reassurance given that fetal well-being is normal at this time ? ?Pelvic pressure in pregnancy ?- Reassurance given that pelvic pressure is a normal variation of pregnancy ? ?[redacted] weeks gestation of pregnancy  ? ?- Discharge patient ?- Keep scheduled appointment with [facility] on 12/12/2021 ?- Patient verbalized an understanding of the plan of care and agrees.  ? ? ?Raelyn Mora, CNM ?12/07/2021, 3:43 PM  ?

## 2021-12-07 NOTE — MAU Note (Addendum)
...  Barbara King is a 26 y.o. at [redacted]w[redacted]d here in MAU reporting: DFM since 0600 this morning. She reports not feeling any movement at all. She is also reporting constant vaginal pressure for one week that is worse when she walks as well as constant abdominal tightness that she last felt in the car on the way here. Denies VB or LOF.  ? ?She reports loose stools for one week now that occurs around 7 times per day. She reports she feels as if this is when her vaginal pressure began. ? ?Pain score:  ?3/10 vagina ? ?FHT: 135 initial external ?Lab orders placed from triage: UA  ? ?

## 2021-12-07 NOTE — Discharge Instructions (Signed)
2/3-1-1 Rule Go to MAU for painful contractions every 2-3 minutes, lasting 1 minute each for 1.5 hours.  

## 2021-12-20 ENCOUNTER — Other Ambulatory Visit: Payer: Self-pay

## 2021-12-20 ENCOUNTER — Inpatient Hospital Stay (EMERGENCY_DEPARTMENT_HOSPITAL)
Admission: AD | Admit: 2021-12-20 | Discharge: 2021-12-20 | Disposition: A | Discharge status: Home / Self Care | Payer: 59 | Source: Home / Self Care | Attending: Obstetrics & Gynecology | Admitting: Obstetrics & Gynecology

## 2021-12-20 ENCOUNTER — Encounter (HOSPITAL_COMMUNITY): Payer: Self-pay | Admitting: Obstetrics & Gynecology

## 2021-12-20 ENCOUNTER — Inpatient Hospital Stay (HOSPITAL_COMMUNITY)
Admission: AD | Admit: 2021-12-20 | Discharge: 2021-12-23 | DRG: 807 | Disposition: A | Discharge status: Home / Self Care | Payer: 59 | Attending: Obstetrics & Gynecology | Admitting: Obstetrics & Gynecology

## 2021-12-20 DIAGNOSIS — Z7982 Long term (current) use of aspirin: Secondary | ICD-10-CM | POA: Diagnosis not present

## 2021-12-20 DIAGNOSIS — O42913 Preterm premature rupture of membranes, unspecified as to length of time between rupture and onset of labor, third trimester: Secondary | ICD-10-CM | POA: Diagnosis not present

## 2021-12-20 DIAGNOSIS — O26893 Other specified pregnancy related conditions, third trimester: Secondary | ICD-10-CM | POA: Insufficient documentation

## 2021-12-20 DIAGNOSIS — Z0371 Encounter for suspected problem with amniotic cavity and membrane ruled out: Secondary | ICD-10-CM | POA: Diagnosis not present

## 2021-12-20 DIAGNOSIS — Z3A38 38 weeks gestation of pregnancy: Secondary | ICD-10-CM

## 2021-12-20 LAB — CBC
HCT: 40.3 % (ref 36.0–46.0)
Hemoglobin: 13.8 g/dL (ref 12.0–15.0)
MCH: 29 pg (ref 26.0–34.0)
MCHC: 34.2 g/dL (ref 30.0–36.0)
MCV: 84.7 fL (ref 80.0–100.0)
Platelets: 177 10*3/uL (ref 150–400)
RBC: 4.76 MIL/uL (ref 3.87–5.11)
RDW: 14.9 % (ref 11.5–15.5)
WBC: 9.1 10*3/uL (ref 4.0–10.5)
nRBC: 0 % (ref 0.0–0.2)

## 2021-12-20 LAB — TYPE AND SCREEN
ABO/RH(D): A POS
Antibody Screen: NEGATIVE

## 2021-12-20 LAB — POCT FERN TEST
POCT Fern Test: NEGATIVE
POCT Fern Test: POSITIVE

## 2021-12-20 LAB — AMNISURE RUPTURE OF MEMBRANE (ROM) NOT AT ARMC: Amnisure ROM: NEGATIVE

## 2021-12-20 MED ORDER — LACTATED RINGERS IV SOLN: 500.0000 mL | INTRAVENOUS | Status: DC | PRN

## 2021-12-20 MED ORDER — ACETAMINOPHEN 325 MG PO TABS: 650.0000 mg | ORAL_TABLET | ORAL | Status: DC | PRN

## 2021-12-20 MED ORDER — SOD CITRATE-CITRIC ACID 500-334 MG/5ML PO SOLN: 30.0000 mL | ORAL | Status: DC | PRN

## 2021-12-20 MED ORDER — FENTANYL CITRATE (PF) 100 MCG/2ML IJ SOLN
50.0000 ug | INTRAMUSCULAR | Status: DC | PRN
  Administered 2021-12-21 (×3): 100 ug via INTRAVENOUS
  Filled 2021-12-20 (×3): qty 2

## 2021-12-20 MED ORDER — ONDANSETRON HCL 4 MG/2ML IJ SOLN
4.0000 mg | Freq: Four times a day (QID) | INTRAMUSCULAR | Status: DC | PRN
  Filled 2021-12-20: qty 2

## 2021-12-20 MED ORDER — OXYTOCIN BOLUS FROM INFUSION: 333.0000 mL | Freq: Once | INTRAVENOUS | Status: DC

## 2021-12-20 MED ORDER — LACTATED RINGERS IV SOLN: INTRAVENOUS | Status: DC

## 2021-12-20 MED ORDER — OXYTOCIN-SODIUM CHLORIDE 30-0.9 UT/500ML-% IV SOLN: 2.5000 [IU]/h | INTRAVENOUS | Status: DC

## 2021-12-20 MED ORDER — OXYTOCIN-SODIUM CHLORIDE 30-0.9 UT/500ML-% IV SOLN
INTRAVENOUS | Status: AC
  Filled 2021-12-20: qty 500

## 2021-12-20 MED ORDER — LIDOCAINE HCL (PF) 1 % IJ SOLN: 30.0000 mL | INTRAMUSCULAR | Status: DC | PRN

## 2021-12-20 NOTE — H&P (Signed)
OB ADMISSION/ HISTORY & PHYSICAL: ? ?Admission Date: 12/20/2021  8:55 PM  ?Admit Diagnosis: Normal labor ? ?Barbara King is a Barbara y.o. female G1P0 [redacted]w[redacted]d presenting for LOF. Endorses active FM, denies contractions, and vaginal bleeding. SROM  confirmed in MAU, rupture occurred @ 2030, clear. Hx of asthma, potential macrosomia EFW 98% on anatomy scan, and 8# 7oz (92%) 38 wk [place].  ? ?History of current pregnancy: ?G1P0   ?Patient entered care with [facility] at 15+4 wks.   ?EDC 01/01/22 by LMP and congruent w/ 15+3 wk U/S.   ?Anatomy scan:  20+2 wks, complete w/ anterior placenta EFW 98%.   ?Antenatal testing: N/A ?Last evaluation: 38 wks vertex. Anterior placenta/ AFI 6.4/ EFW= 8# 7oz (92%), BPP 8/8 ? ?Significant prenatal events:  ?Patient Active Problem List  ? Diagnosis Date Noted  ? Normal labor 12/20/2021  ? ? ?Prenatal Labs: ?ABO, Rh: --/--/A POS (05/04 2222) ?Antibody: NEG (05/04 2222) ?Rubella:   immune ?RPR:   NR ?HBsAg:   NR ?HIV:   NR ?GTT: normal 1 hr ?GBS: Negative/-- (04/18 0000)  ?GC/CHL: neg.neg ?Genetics: low-risk, normal AFP ?Tdap/influenza vaccines: tdap current, declined flu ? ? ?OB History  ?Gravida Para Term Preterm AB Living  ?1            ?SAB IAB Ectopic Multiple Live Births  ?           ?  ?# Outcome Date GA Lbr Len/2nd Weight Sex Delivery Anes PTL Lv  ?1 Current           ? ? ?Medical / Surgical History: ?Past medical history:  ?Past Medical History:  ?Diagnosis Date  ? Asthma   ?  ?Past surgical history:  ?Past Surgical History:  ?Procedure Laterality Date  ? HERNIA REPAIR    ? ?Family History: History reviewed. No pertinent family history.  ?Social History:  reports that she has never smoked. She has never used smokeless tobacco. She reports that she does not drink alcohol and does not use drugs. ? ?Allergies: ?Patient has no known allergies. ?  ?Current Medications at time of admission:  ?Prior to Admission medications   ?Medication Sig Start Date End Date Taking? Authorizing Provider  ?Prenatal  Vit-Fe Fumarate-FA (PRENATAL MULTIVITAMIN) TABS tablet Take 1 tablet by mouth daily at 12 noon.   Yes [provider]  ?aspirin 81 MG chewable tablet Chew by mouth daily.    [provider]  ?ferrous sulfate 324 MG TBEC Take 324 mg by mouth.    [provider]  ? ? ?Review of Systems: ?Constitutional: Negative   ?HENT: Negative   ?Eyes: Negative   ?Respiratory: Negative   ?Cardiovascular: Negative   ?Gastrointestinal: Negative  ?Genitourinary: neg for bloody show, pos for LOF   ?Musculoskeletal: Negative   ?Skin: Negative   ?Neurological: Negative   ?Endo/Heme/Allergies: Negative   ?Psychiatric/Behavioral: Negative  ? ? ?Physical Exam: ?VS: Blood pressure 118/72, pulse (!) 109, height 5\' 4"  (1.626 m), weight 114.3 kg, SpO2 100 %. ?AAO x3, no signs of distress ?Cardiovascular: RRR ?Respiratory: Lung fields clear to ausculation ?GU/GI: Abdomen gravid, non-tender, non-distended, active FM, vertex ?Extremities:trace edema, negative for pain, tenderness, and cords ? ?Cervical exam:Dilation: 2 ?Effacement (%): 50 ?Station: -3 ?Exam by:: [email or web address], CNM ?FHR: baseline rate 155 / variability moderate / accelerations present / absent decelerations ?TOCO: 5-6 ? ? ?Prenatal Transfer Tool  ?Maternal Diabetes: No ?Genetic Screening: Normal ?Maternal Ultrasounds/Referrals: Normal ?Fetal Ultrasounds or other Referrals:  None ?Maternal Substance Abuse:  No ?Significant Maternal Medications:  None ?Significant Maternal Lab Results: Group B Strep negative ? ? ? ?Assessment: ?Barbara y.o. G1P0 [redacted]w[redacted]d ?Normal labor ?SROM, clear ? ?Latent stage of labor ?FHR category 1 ?GBS neg ?Pain management plan: IV sedation and epidural ? ? ?Plan:  ?Admit to L&D ?Routine admission orders ?Epidural PRN ?Pitocin 2x2 ? ?Dr Sallye Ober notified of admission and plan of care ? ?Roma Schanz MSN, CNM ?12/20/2021 10:39 PM ? ?

## 2021-12-20 NOTE — MAU Note (Signed)
.  Mohogany TARLA FRYAR is a 26 y.o. at [redacted]w[redacted]d here in MAU reporting possible SROM at 2030. Fld was clear with alittle pink in it. Gracey continues to leak. Was here earlier today thinking water had broken but was sent back home. No sve earlier today. Denies pain and reports good/ ?Onset of complaint 2030 ?Pain score:  ?There were no vitals filed for this visit.   ?FHT:134 ?Lab orders placed from triage: mau labor eval  ? ?

## 2021-12-20 NOTE — MAU Note (Signed)
.  Barbara King is a 26 y.o. at [redacted]w[redacted]d here in MAU reporting: ? Leaking fluid since 7 am, some cramping and contractions but "they don't hurt", reports positive fetal movement. SVE closed on Tuesday ? ?Onset of complaint: 0700 ?Pain score: 0/10 ?Vitals:  ? 12/20/21 1551  ?BP: 114/76  ?Pulse: (!) 103  ?Resp: 17  ?Temp: 98.7 ?F (37.1 ?C)  ?SpO2: 98%  ?   ?FHT:142 ?Lab orders placed from triage:   ? ?

## 2021-12-20 NOTE — MAU Provider Note (Signed)
S: [name] is a 26 y.o. G1P0 at [redacted]w[redacted]d  who presents to MAU today complaining of leaking of fluid since 0900 this morning. She denies vaginal bleeding. She denies contractions. She reports normal fetal movement.   ? ?O: BP 114/76   Pulse (!) 103   Temp 98.7 ?F (37.1 ?C) (Oral)   Resp 17   SpO2 98%  ?GENERAL: Well-developed, well-nourished female in no acute distress.  ?HEAD: Normocephalic, atraumatic.  ?CHEST: Normal effort of breathing, regular heart rate ?ABDOMEN: Soft, nontender, gravid ?PELVIC: Normal external female genitalia. Vagina is pink and rugated. Cervix with normal contour, no lesions. Normal discharge. No pooling.  ? ?Fetal Monitoring: ?Baseline: 150 ?Variability: moderate ?Accelerations: 15x15 ?Decelerations: no ?Contractions: irregular ? ?Results for orders placed or performed during the hospital encounter of 12/20/21 (from the past 24 hour(s))  ?POCT fern test     Status: Normal  ? Collection Time: 12/20/21  4:15 PM  ?Result Value Ref Range  ? POCT Fern Test Negative = intact amniotic membranes   ?Amnisure rupture of membrane (rom)not at [facility]     Status: None  ? Collection Time: 12/20/21  4:41 PM  ?Result Value Ref Range  ? Amnisure ROM NEGATIVE   ? ? ? ?A: ?SIUP at [redacted]w[redacted]d  ?Membranes intact ? ?P: ?Discharge home in stable condition ?Labor precauitons ?F/u with OB as scheduled ? ?Judeth Horn, NP ?12/20/2021 5:12 PM ? ?

## 2021-12-21 ENCOUNTER — Inpatient Hospital Stay (HOSPITAL_COMMUNITY): Payer: 59 | Admitting: Anesthesiology

## 2021-12-21 ENCOUNTER — Encounter (HOSPITAL_COMMUNITY): Payer: Self-pay | Admitting: Obstetrics & Gynecology

## 2021-12-21 DIAGNOSIS — Z3A38 38 weeks gestation of pregnancy: Secondary | ICD-10-CM

## 2021-12-21 DIAGNOSIS — O42913 Preterm premature rupture of membranes, unspecified as to length of time between rupture and onset of labor, third trimester: Secondary | ICD-10-CM

## 2021-12-21 LAB — RPR: RPR Ser Ql: NONREACTIVE

## 2021-12-21 MED ORDER — IBUPROFEN 600 MG PO TABS
600.0000 mg | ORAL_TABLET | Freq: Four times a day (QID) | ORAL | Status: DC
  Administered 2021-12-22 – 2021-12-23 (×5): 600 mg via ORAL
  Filled 2021-12-21 (×7): qty 1

## 2021-12-21 MED ORDER — LIDOCAINE HCL (PF) 1 % IJ SOLN
INTRAMUSCULAR | Status: DC | PRN
  Administered 2021-12-21 (×2): 5 mL via EPIDURAL

## 2021-12-21 MED ORDER — ONDANSETRON HCL 4 MG PO TABS: 4.0000 mg | ORAL_TABLET | ORAL | Status: DC | PRN

## 2021-12-21 MED ORDER — TETANUS-DIPHTH-ACELL PERTUSSIS 5-2.5-18.5 LF-MCG/0.5 IM SUSY: 0.5000 mL | PREFILLED_SYRINGE | Freq: Once | INTRAMUSCULAR | Status: DC

## 2021-12-21 MED ORDER — PHENYLEPHRINE 80 MCG/ML (10ML) SYRINGE FOR IV PUSH (FOR BLOOD PRESSURE SUPPORT): 80.0000 ug | PREFILLED_SYRINGE | INTRAVENOUS | Status: DC | PRN

## 2021-12-21 MED ORDER — LACTATED RINGERS IV SOLN: 500.0000 mL | Freq: Once | INTRAVENOUS | Status: DC

## 2021-12-21 MED ORDER — DIPHENHYDRAMINE HCL 25 MG PO CAPS: 25.0000 mg | ORAL_CAPSULE | Freq: Four times a day (QID) | ORAL | Status: DC | PRN

## 2021-12-21 MED ORDER — TERBUTALINE SULFATE 1 MG/ML IJ SOLN: 0.2500 mg | Freq: Once | INTRAMUSCULAR | Status: DC | PRN

## 2021-12-21 MED ORDER — WITCH HAZEL-GLYCERIN EX PADS: 1.0000 "application " | MEDICATED_PAD | CUTANEOUS | Status: DC | PRN

## 2021-12-21 MED ORDER — BENZOCAINE-MENTHOL 20-0.5 % EX AERO
1.0000 "application " | INHALATION_SPRAY | CUTANEOUS | Status: DC | PRN
  Administered 2021-12-21: 1 via TOPICAL
  Filled 2021-12-21: qty 56

## 2021-12-21 MED ORDER — OXYTOCIN-SODIUM CHLORIDE 30-0.9 UT/500ML-% IV SOLN
1.0000 m[IU]/min | INTRAVENOUS | Status: DC
  Administered 2021-12-21: 2 m[IU]/min via INTRAVENOUS

## 2021-12-21 MED ORDER — EPHEDRINE 5 MG/ML INJ: 10.0000 mg | INTRAVENOUS | Status: DC | PRN

## 2021-12-21 MED ORDER — COCONUT OIL OIL: 1.0000 "application " | TOPICAL_OIL | Status: DC | PRN

## 2021-12-21 MED ORDER — FENTANYL-BUPIVACAINE-NACL 0.5-0.125-0.9 MG/250ML-% EP SOLN
12.0000 mL/h | EPIDURAL | Status: DC | PRN
  Administered 2021-12-21: 12 mL/h via EPIDURAL
  Filled 2021-12-21: qty 250

## 2021-12-21 MED ORDER — PRENATAL MULTIVITAMIN CH
1.0000 | ORAL_TABLET | Freq: Every day | ORAL | Status: DC
  Administered 2021-12-22 – 2021-12-23 (×2): 1 via ORAL
  Filled 2021-12-21 (×2): qty 1

## 2021-12-21 MED ORDER — ONDANSETRON HCL 4 MG/2ML IJ SOLN: 4.0000 mg | INTRAMUSCULAR | Status: DC | PRN

## 2021-12-21 MED ORDER — SIMETHICONE 80 MG PO CHEW: 80.0000 mg | CHEWABLE_TABLET | ORAL | Status: DC | PRN

## 2021-12-21 MED ORDER — DIBUCAINE (PERIANAL) 1 % EX OINT: 1.0000 "application " | TOPICAL_OINTMENT | CUTANEOUS | Status: DC | PRN

## 2021-12-21 MED ORDER — ACETAMINOPHEN 325 MG PO TABS: 650.0000 mg | ORAL_TABLET | ORAL | Status: DC | PRN

## 2021-12-21 MED ORDER — MEASLES, MUMPS & RUBELLA VAC IJ SOLR: 0.5000 mL | Freq: Once | INTRAMUSCULAR | Status: DC

## 2021-12-21 MED ORDER — ZOLPIDEM TARTRATE 5 MG PO TABS
5.0000 mg | ORAL_TABLET | Freq: Every evening | ORAL | Status: DC | PRN
Start: 2021-12-21 — End: 2021-12-23

## 2021-12-21 MED ORDER — SENNOSIDES-DOCUSATE SODIUM 8.6-50 MG PO TABS
2.0000 | ORAL_TABLET | Freq: Every day | ORAL | Status: DC
  Administered 2021-12-22: 2 via ORAL
  Filled 2021-12-21: qty 2

## 2021-12-21 MED ORDER — DIPHENHYDRAMINE HCL 50 MG/ML IJ SOLN: 12.5000 mg | INTRAMUSCULAR | Status: DC | PRN

## 2021-12-21 NOTE — Anesthesia Procedure Notes (Signed)
Epidural ?Patient location during procedure: OB ?Start time: 12/21/2021 9:47 AM ?End time: 12/21/2021 9:55 AM ? ?Staffing ?Anesthesiologist: Mal Amabile, MD ?Performed: anesthesiologist  ? ?Preanesthetic Checklist ?Completed: patient identified, IV checked, site marked, risks and benefits discussed, surgical consent, monitors and equipment checked, pre-op evaluation and timeout performed ? ?Epidural ?Patient position: sitting ?Prep: DuraPrep and site prepped and draped ?Patient monitoring: continuous pulse ox and blood pressure ?Approach: midline ?Location: L3-L4 ?Injection technique: LOR air ? ?Needle:  ?Needle type: Tuohy  ?Needle gauge: 17 G ?Needle length: 9 cm and 9 ?Needle insertion depth: 6 cm ?Catheter type: closed end flexible ?Catheter size: 19 Gauge ?Catheter at skin depth: 11 cm ?Test dose: negative and Other ? ?Assessment ?Events: blood not aspirated, injection not painful, no injection resistance, no paresthesia and negative IV test ? ?Additional Notes ?Patient identified. Risks and benefits discussed including failed block, incomplete  ?Pain control, post dural puncture headache, nerve damage, paralysis, blood pressure ?Changes, nausea, vomiting, reactions to medications-both toxic and allergic and post ?Partum back pain. All questions were answered. Patient expressed understanding and wished to proceed. Sterile technique was used throughout procedure. Epidural site was ?Dressed with sterile barrier dressing. No paresthesias, signs of intravascular injection ?Or signs of intrathecal spread were encountered.  ?Patient was more comfortable after the epidural was dosed. ?Please see RN's note for documentation of vital signs and FHR which are stable. ?Reason for block:procedure for pain ? ? ? ?

## 2021-12-21 NOTE — Lactation Note (Signed)
This note was copied from a baby's chart. ?Lactation Consultation Note ? ?Patient Name: Barbara King ?Today's Date: 12/21/2021 ?Reason for consult: L&D Initial assessment ?Age:25 hours ? ?P2, Baby cueing.  Mother states she would like to pump and breastfeed.  Assisted with latch.  Lactation to follow up on MBU.  ? ?Maternal Data ?Does the patient have breastfeeding experience prior to this delivery?: No ? ?LATCH Score ?Latch: Grasps breast easily, tongue down, lips flanged, rhythmical sucking. ? ?Audible Swallowing: A few with stimulation ? ?Type of Nipple: Everted at rest and after stimulation ? ?Comfort (Breast/Nipple): Soft / non-tender ? ?Hold (Positioning): Assistance needed to correctly position infant at breast and maintain latch. ? ?LATCH Score: 8 ? ? ?Interventions ?Interventions: Assisted with latch;Skin to skin;Education ? ?Discharge ?  ? ?Consult Status ?Consult Status: Follow-up from L&D ? ? ? ?Dahlia Byes Boschen ?12/21/2021, 12:44 PM ? ? ? ?

## 2021-12-21 NOTE — Progress Notes (Signed)
Subjective:   ? ?Breathing with contractions and planning an epidural. S/O and cousin present and supportive. Discussed labor augmentation w/ Pitocin and pt agrees.  ? ?Objective:   ? ?VS: BP 123/70   Pulse 92   Temp 98.4 ?F (36.9 ?C) (Oral)   Resp 16   Ht 5\' 4"  (1.626 m)   Wt 114.3 kg   SpO2 100%   BMI 43.26 kg/m?  ?FHR : baseline 135 / variability moderate / accelerations present / early decelerations ?Toco: contractions every 2-3 minutes  ?Membranes: SROM x 8.5 hrs, remains clear ?Dilation: 2 ?Effacement (%): 50 ?Station: -3 ?Presentation: Vertex ?Exam by:: [email or web address], CNM ? ? ?Assessment/Plan:  ? ?26 y.o. G1P0 [redacted]w[redacted]d ?Normal labor ?SROM-clear ? ?Labor:  Augmenting with Pitocin ?Fetal Wellbeing:  Category I ?Pain Control:   planning epidural ?I/D:   GBS neg ?Anticipated MOD:  NSVD ? ?[redacted]w[redacted]d MSN, CNM ?12/21/2021 4:17 AM ? ?

## 2021-12-21 NOTE — Anesthesia Preprocedure Evaluation (Signed)
Anesthesia Evaluation  ?Patient identified by MRN, date of birth, ID band ?Patient awake ? ? ? ?Reviewed: ?Allergy & Precautions, Patient's Chart, lab work & pertinent test results ? ?Airway ?Mallampati: III ? ?TM Distance: >3 FB ?Neck ROM: Full ? ? ? Dental ?no notable dental hx. ?(+) Teeth Intact ?  ?Pulmonary ?asthma ,  ?  ?Pulmonary exam normal ?breath sounds clear to auscultation ? ? ? ? ? ? Cardiovascular ?negative cardio ROS ?Normal cardiovascular exam ?Rhythm:Regular Rate:Normal ? ? ?  ?Neuro/Psych ?negative neurological ROS ? negative psych ROS  ? GI/Hepatic ?Neg liver ROS, GERD  ,  ?Endo/Other  ?Morbid obesity ? Renal/GU ?negative Renal ROS  ?negative genitourinary ?  ?Musculoskeletal ?negative musculoskeletal ROS ?(+)  ? Abdominal ?(+) + obese,   ?Peds ? Hematology ? ?(+) Blood dyscrasia, anemia ,   ?Anesthesia Other Findings ? ? Reproductive/Obstetrics ?(+) Pregnancy ? ?  ? ? ? ? ? ? ? ? ? ? ? ? ? ?  ?  ? ? ? ? ? ? ? ? ?Anesthesia Physical ?Anesthesia Plan ? ?ASA: 3 ? ?Anesthesia Plan: Epidural  ? ?Post-op Pain Management:   ? ?Induction:  ? ?PONV Risk Score and Plan:  ? ?Airway Management Planned: Natural Airway ? ?Additional Equipment:  ? ?Intra-op Plan:  ? ?Post-operative Plan:  ? ?Informed Consent: I have reviewed the patients History and Physical, chart, labs and discussed the procedure including the risks, benefits and alternatives for the proposed anesthesia with the patient or authorized representative who has indicated his/her understanding and acceptance.  ? ? ? ? ? ?Plan Discussed with: Anesthesiologist ? ?Anesthesia Plan Comments:   ? ? ? ? ? ? ?Anesthesia Quick Evaluation ? ?

## 2021-12-21 NOTE — Lactation Note (Addendum)
This note was copied from a baby's chart. ?Lactation Consultation Note ? ?Patient Name: Barbara King ?Today's Date: 12/21/2021 ?Reason for consult: Initial assessment;Early term 37-38.6wks;Primapara;1st time breastfeeding ?Age:25 hours ? ? ?P1 mother whose infant is now 49 hours old.  This is an early term infant at 38+3 weeks.  Mother's current feeding preference is breast/formula. ? ?Mother was preparing to formula feed "Barbara King" when I arrived.  She informed me that she desires to wait until she gets home to pump.  Educated mother on the importance of pumping early for breast stimulation which will help ensure a good milk supply.  Mother verbalized understanding, however, declined pump set up at this time.  Asked mother to consider starting this evening if desired and to call her RN for assistance. ? ?Reviewed supplementation guideline sheet and feeding log sheet with parents.  Taught paced bottle feeding and "Austin" consumed 15 mls easily using the yellow slow flow nipple.  Demonstrated burping and placed him STS on mother's chest after feeding.  RN updated. ? ? ?Maternal Data ?Has patient been taught Hand Expression?: No ?Does the patient have breastfeeding experience prior to this delivery?: No ? ?Feeding ?Mother's Current Feeding Choice: Breast Milk and Formula ?Nipple Type: Slow - flow ? ?LATCH Score ?Latch: Grasps breast easily, tongue down, lips flanged, rhythmical sucking. ? ?Audible Swallowing: A few with stimulation ? ?Type of Nipple: Everted at rest and after stimulation ? ?Comfort (Breast/Nipple): Soft / non-tender ? ?Hold (Positioning): Assistance needed to correctly position infant at breast and maintain latch. ? ?LATCH Score: 8 ? ? ?Lactation Tools Discussed/Used ?  ? ?Interventions ?Interventions: Assisted with latch;Skin to skin;Education ? ?Discharge ?Pump: Personal ? ?Consult Status ?Consult Status: Follow-up ?Date: 12/22/21 ?Follow-up type: In-patient ? ? ? ?Raynard Mapps R Maxine Fredman ?12/21/2021, 4:18  PM ? ? ? ?

## 2021-12-22 LAB — CBC
HCT: 38 % (ref 36.0–46.0)
Hemoglobin: 12.7 g/dL (ref 12.0–15.0)
MCH: 28.2 pg (ref 26.0–34.0)
MCHC: 33.4 g/dL (ref 30.0–36.0)
MCV: 84.3 fL (ref 80.0–100.0)
Platelets: 167 10*3/uL (ref 150–400)
RBC: 4.51 MIL/uL (ref 3.87–5.11)
RDW: 14.9 % (ref 11.5–15.5)
WBC: 11.2 10*3/uL — ABNORMAL HIGH (ref 4.0–10.5)
nRBC: 0 % (ref 0.0–0.2)

## 2021-12-22 LAB — BIRTH TISSUE RECOVERY COLLECTION (PLACENTA DONATION)

## 2021-12-22 MED ORDER — IBUPROFEN 600 MG PO TABS: 600.0000 mg | ORAL_TABLET | Freq: Four times a day (QID) | ORAL | 0 refills | Status: DC | PRN

## 2021-12-22 MED ORDER — ACETAMINOPHEN 325 MG PO TABS
650.0000 mg | ORAL_TABLET | ORAL | Status: DC | PRN
Start: 2021-12-22 — End: 2024-05-17

## 2021-12-22 NOTE — Anesthesia Postprocedure Evaluation (Signed)
Anesthesia Post Note ? ?Patient: Barbara King ? ?Procedure(s) Performed: AN AD HOC LABOR EPIDURAL ? ?  ? ?Patient location during evaluation: Mother Baby ?Anesthesia Type: Epidural ?Level of consciousness: awake ?Pain management: satisfactory to patient ?Vital Signs Assessment: post-procedure vital signs reviewed and stable ?Respiratory status: spontaneous breathing ?Cardiovascular status: stable ?Anesthetic complications: no ? ? ?No notable events documented. ? ?Last Vitals:  ?Vitals:  ? 12/21/21 2339 12/22/21 0454  ?BP: 114/71 130/77  ?Pulse: (!) 103 89  ?Resp: 18 16  ?Temp: 36.8 ?C 36.8 ?C  ?SpO2: 100% 100%  ?  ?Last Pain:  ?Vitals:  ? 12/22/21 0635  ?TempSrc:   ?PainSc: 5   ? ?Pain Goal:   ? ?  ?  ?  ?  ?  ?  ?  ? ?Dasia Guerrier ? ? ? ? ?

## 2021-12-22 NOTE — Progress Notes (Signed)
Post Partum Day 1 ?Subjective: ?no complaints, up ad lib, voiding, tolerating PO, and + flatus ? ?Objective: ?Blood pressure 130/77, pulse 89, temperature 98.3 ?F (36.8 ?C), temperature source Oral, resp. rate 16, height 5\' 4"  (1.626 m), weight 114.3 kg, SpO2 100 %. ? ?Physical Exam:  ?General: alert, cooperative, and no distress ?Lochia: appropriate ?Uterine Fundus: firm ?Incision: NA ?DVT Evaluation: No evidence of DVT seen on physical exam. ? ?Recent Labs  ?  12/20/21 ?2222 12/22/21 ?[id number]  ?HGB 13.8 12.7  ?HCT 40.3 38.0  ? ? ?Assessment/Plan: ?Plan for discharge tomorrow, Breastfeeding, and Lactation consult ?Dr. Charlesetta Garibaldi planing to perform circumcision of newborn prior to discharge.  ? ? LOS: 2 days  ? ?Barbara King ?12/22/2021, 12:12 PM  ? ? ?

## 2021-12-23 NOTE — Discharge Summary (Signed)
? ?  Postpartum Discharge Summary ? ?Date of Service updated 12/23/2021 ? ?   ?Patient Name: Barbara King ?DOB: 1996/07/16 ?MRN: [id number] ? ?Date of admission: 12/20/2021 ?Delivery date:12/21/2021  ?Delivering provider: Verita Schneiders A  ?Date of discharge: 12/23/2021 ? ?Admitting diagnosis: Normal labor [O80, Z37.9] ?Intrauterine pregnancy: [redacted]w[redacted]d    ?Secondary diagnosis:  Principal Problem: ?  Normal labor ? ?Additional problems: None    ?Discharge diagnosis: Term Pregnancy Delivered                                              ?Post partum procedures: None ?Augmentation: N/A ?Complications: None ? ?Hospital course: Onset of Labor With Vaginal Delivery      ?26y.o. yo G1P0 at 376w5das admitted in Active Labor on 12/20/2021. Patient had an uncomplicated labor course as follows:  ?Membrane Rupture Time/Date: 8:30 PM ,12/20/2021   ?Delivery Method:Vaginal, Spontaneous  ?Episiotomy: None  ?Lacerations:  Labial  ?Patient had an uncomplicated postpartum course.  She is ambulating, tolerating a regular diet, passing flatus, and urinating well. Patient is discharged home in stable condition on 12/23/21. ? ?Newborn Data: ?Birth date:12/[birth year]/2021  ?Birth time:11:58 AM  ?Gender:Female  ?Living status:Living  ?Apgars:9 ,9  ?Weight:3360 g  ? ?Magnesium Sulfate received: No ?BMZ received: No ?Rhophylac:N/A ?MMR:N/A ?T-DaP: Unknown ?Flu: N/A ?Transfusion:No ? ?Physical exam  ?Vitals:  ? 12/22/21 0454 12/22/21 1500 12/22/21 2100 12/23/21 0637  ?BP: 130/77 98/77 107/60 93/65  ?Pulse: 89 82 96 95  ?Resp: _0 ?Temp: 98.3 ?F (36.8 ?C) 98.7 ?F (37.1 ?C) 98.8 ?F (37.1 ?C) 98.7 ?F (37.1 ?C)  ?TempSrc: Oral Oral Oral Oral  ?SpO2: 100%  98% 95%  ?Weight:      ?Height:      ? ?General: alert, cooperative, and no distress ?Lochia: appropriate ?Uterine Fundus: firm ?Incision: N/A ?DVT Evaluation: No evidence of DVT seen on physical exam. ?Labs: ?Lab Results  ?Component Value Date  ? WBC 11.2 (H) 12/22/2021  ? HGB 12.7 12/22/2021  ? HCT 38.0  12/22/2021  ? MCV 84.3 12/22/2021  ? PLT 167 12/22/2021  ? ?   ? View : No data to display.  ?  ?  ?  ? ?Edinburgh Score: ? ?  12/21/2021  ?  2:48 PM  ?Edinburgh Postnatal Depression Scale Screening Tool  ?I have been able to laugh and see the funny side of things. 0  ?I have looked forward with enjoyment to things. 0  ?I have blamed myself unnecessarily when things went wrong. 1  ?I have been anxious or worried for no good reason. 2  ?I have felt scared or panicky for no good reason. 2  ?Things have been getting on top of me. 1  ?I have been so unhappy that I have had difficulty sleeping. 0  ?I have felt sad or miserable. 0  ?I have been so unhappy that I have been crying. 1  ?The thought of harming myself has occurred to me. 0  ?Edinburgh Postnatal Depression Scale Total 7  ? ? ? ? ?After visit meds:  ?Allergies as of 12/23/2021   ?No Known Allergies ?  ? ?  ?Medication List  ?  ? ?TAKE these medications   ? ?acetaminophen 325 MG tablet ?Commonly known as: Tylenol ?Take 2 tablets (650 mg total) by mouth every 4 (four) hours as needed (  for pain scale < 4). ?  ?albuterol 108 (90 Base) MCG/ACT inhaler ?Commonly known as: VENTOLIN HFA ?Inhale 2 puffs into the lungs every 6 (six) hours as needed for wheezing or shortness of breath. ?  ?ibuprofen 600 MG tablet ?Commonly known as: ADVIL ?Take 1 tablet (600 mg total) by mouth every 6 (six) hours as needed. ?  ?prenatal multivitamin Tabs tablet ?Take 1 tablet by mouth daily at 12 noon. ?  ? ?  ? ? ? ?Discharge home in stable condition ?Infant Feeding: Breast ?Infant Disposition:home with mother ?Discharge instruction: per After Visit Summary and Postpartum booklet. ?Activity: Advance as tolerated. Pelvic rest for 6 weeks.  ?Diet: routine diet ?Anticipated Birth Control: Unsure ?Postpartum Appointment:2 weeks ?Additional Postpartum F/U: Postpartum Depression checkup ?Future Appointments:No future appointments. ?Follow up Visit: ? Follow-up Information   ? ? Crawford Givens, MD.  Schedule an appointment as soon as possible for a visit in 2 week(s).   ?Specialty: Obstetrics and Gynecology ?Why: please schedule an appt with Dr. Charlesetta Garibaldi in 2 weeks for a postpartum visit to evaluate for postpartum depression ?Contact information: ?[address] ?[address] ?[place] [address] ?[phone] ? ? ?  ?  ? ?  ?  ? ?  ? ? ? ?  ? ?12/23/2021 ?Christophe Louis, MD ? ? ?

## 2021-12-23 NOTE — Lactation Note (Signed)
This note was copied from a baby's chart. ?Lactation Consultation Note ? ?Patient Name: Barbara King ?Today's Date: 12/23/2021 ?Reason for consult: Follow-up assessment;Early term 37-38.6wks;Primapara ?Age:25 hours ? ? ?P1 mother whose infant is now 35 hours old.  This is an early term infant at 38+3 weeks.  Mother's current feeding preference is to pump and bottle feed her expressed milk.  She is supplementing with formula until her milk comes to volume. ? ?Mother just finished pumping and was able to obtain 20 mls of milk.  Praised mother for her efforts and reminded her to continue pumping every three hours after discharge.  Discussed hand expression before/after pumping.  Asked her to always feed her milk prior to giving formula and to not mix breast milk and formula.  Mother verbalized understanding.   ? ?Allowed time for questions.  Father and grandmother present and supportive.  Family has our OP phone number for any further concerns. ? ? ?Maternal Data ?  ? ?Feeding ?Mother's Current Feeding Choice: Breast Milk and Formula ?Nipple Type: Slow - flow ? ?LATCH Score ?  ? ?  ? ?  ? ?  ? ?  ? ?  ? ? ?Lactation Tools Discussed/Used ?Tools: Pump ?Breast pump type: Double-Electric Breast Pump;Manual ?Pump Education: Setup, frequency, and cleaning;Milk Storage (Reviewed) ?Reason for Pumping: Mother's request; she will be pumping and bottle feeding her expressed milk ?Pumping frequency: Every three hours ?Pumped volume: 20 mL ? ?Interventions ?Interventions: Education ? ?Discharge ?Discharge Education: Engorgement and breast care ?Pump: Personal ? ?Consult Status ?Consult Status: Complete ?Date: 12/23/21 ?Follow-up type: Call as needed ? ? ? ?Wasil Wolke R Mateja Dier ?12/23/2021, 9:45 AM ? ? ? ?

## 2021-12-29 ENCOUNTER — Telehealth (HOSPITAL_COMMUNITY): Payer: Self-pay

## 2021-12-29 NOTE — Telephone Encounter (Signed)
"  I'm good. Everything is good,feeling good." Patient declines questions or concerns about her healing. ? ?"He's good. He is fussy at night and doesn't want to lay down in his bassinet. He wants to be held. During the daytime he is fine and will lay down to sleep in his bassinet. Do you have any suggestions for that?" RN encouraged patient to meet all of baby's physical needs and try to sooth baby. RN encouraged patient to lay baby down to sleep on his back in a safe sleep space. RN suggested swaddling newborn. "I have tried swaddling and different sleep sacs. I have a pediatrician appointment on Monday." RN suggested speaking with her pediatrician about her concerns. "He sleeps in a bassinet." RN reviewed ABC's of safe sleep with patient. Patient has no other questions or concerns about baby. ? ?EPDS score is 5. ? ?Marcelino Duster Aedyn Kempfer,RN3,MSN,RNC-MNN ?05/13//2023,1426 ?

## 2022-01-01 ENCOUNTER — Inpatient Hospital Stay (HOSPITAL_COMMUNITY): Admission: AD | Admit: 2022-01-01 | Payer: 59 | Source: Home / Self Care | Admitting: Obstetrics and Gynecology

## 2022-01-01 ENCOUNTER — Inpatient Hospital Stay (HOSPITAL_COMMUNITY): Payer: 59

## 2022-02-09 IMAGING — DX DG CHEST 1V
1 series · 1 of 1 positions shown · non-contrast
Comparison: None.

CLINICAL DATA: Physical examination.

EXAM:
CHEST  1 VIEW

[chest pa]
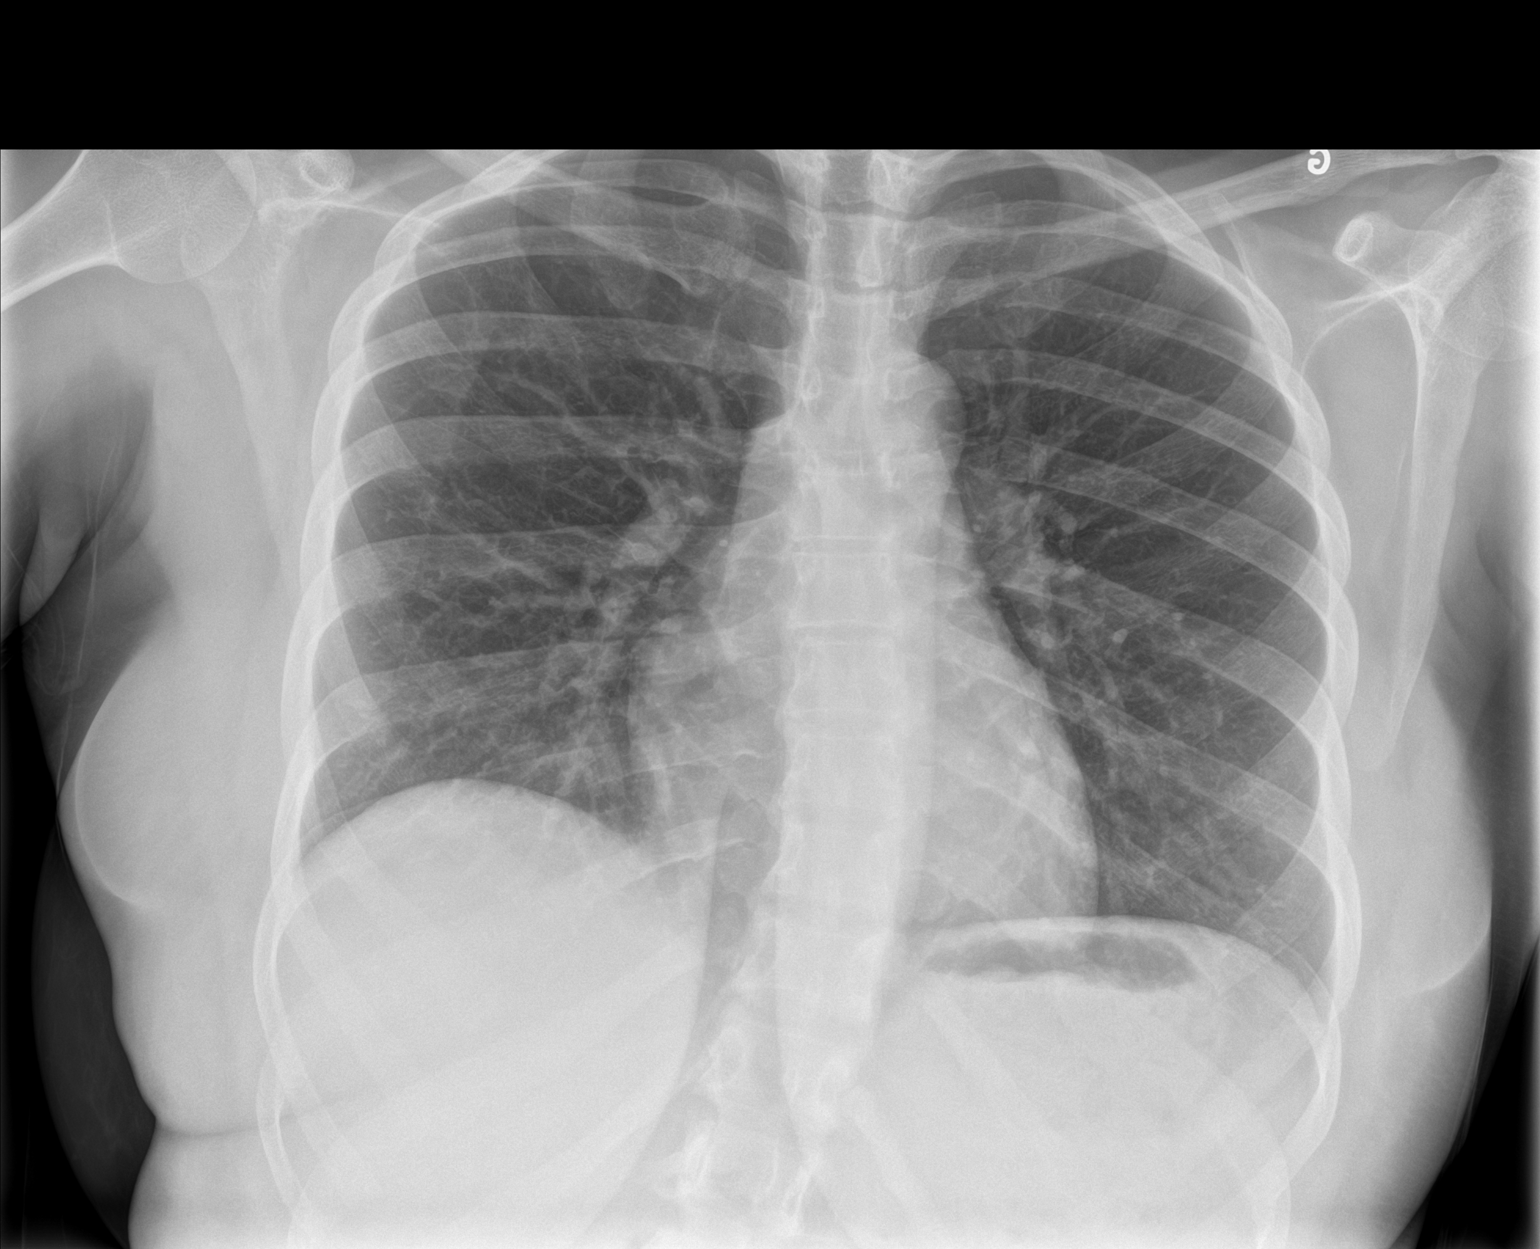

[1 of 1 positions shown; findings below may reference images not displayed]

FINDINGS: Normal sized heart. Clear lungs. Mild-to-moderate dextroconvex
thoracolumbar rotary scoliosis.
IMPRESSION: No acute abnormality.

## 2023-09-30 ENCOUNTER — Ambulatory Visit
Admission: EM | Admit: 2023-09-30 | Discharge: 2023-09-30 | Disposition: A | Discharge status: Home / Self Care | Payer: No Typology Code available for payment source | Attending: Physician Assistant | Admitting: Physician Assistant

## 2023-09-30 DIAGNOSIS — J101 Influenza due to other identified influenza virus with other respiratory manifestations: Secondary | ICD-10-CM | POA: Diagnosis not present

## 2023-09-30 DIAGNOSIS — J45909 Unspecified asthma, uncomplicated: Secondary | ICD-10-CM

## 2023-09-30 LAB — POC COVID19/FLU A&B COMBO
Covid Antigen, POC: NEGATIVE
Influenza A Antigen, POC: POSITIVE — AB
Influenza B Antigen, POC: NEGATIVE

## 2023-09-30 MED ORDER — ACETAMINOPHEN 325 MG PO TABS
650.0000 mg | ORAL_TABLET | Freq: Once | ORAL | Status: AC
  Administered 2023-09-30: 650 mg via ORAL

## 2023-09-30 MED ORDER — OSELTAMIVIR PHOSPHATE 75 MG PO CAPS: 75.0000 mg | ORAL_CAPSULE | Freq: Two times a day (BID) | ORAL | 0 refills | Status: DC

## 2023-09-30 NOTE — ED Provider Notes (Addendum)
[facility]    CSN: [id number] Arrival date & time: 09/30/23  1713      History   Chief Complaint Chief Complaint  Patient presents with   Emesis   Asthma Exacerbation    HPI Barbara King is a 28 y.o. female.   Patient here today for evaluation of cough that started last night.  She reports that she also had some bodyaches and possible fever.  She denies any wheezing but does feel some chest tightness today.  She has been using albuterol with some relief.  She states she used her inhaler once yesterday and once today.  The history is provided by the patient.  Emesis Associated symptoms: cough, fever and myalgias   Associated symptoms: no abdominal pain, no diarrhea and no sore throat     Past Medical History:  Diagnosis Date   Asthma     Patient Active Problem List   Diagnosis Date Noted   Normal labor 12/20/2021    Past Surgical History:  Procedure Laterality Date   HERNIA REPAIR      OB History     Gravida  1   Para      Term      Preterm      AB      Living         SAB      IAB      Ectopic      Multiple      Live Births               Home Medications    Prior to Admission medications   Medication Sig Start Date End Date Taking? Authorizing Provider  acetaminophen (TYLENOL) 325 MG tablet Take 2 tablets (650 mg total) by mouth every 4 (four) hours as needed (for pain scale < 4). 12/22/21  Yes Gerald Leitz, MD  albuterol (VENTOLIN HFA) 108 (90 Base) MCG/ACT inhaler Inhale 2 puffs into the lungs every 6 (six) hours as needed for wheezing or shortness of breath.   Yes [provider]  oseltamivir (TAMIFLU) 75 MG capsule Take 1 capsule (75 mg total) by mouth every 12 (twelve) hours. 09/30/23  Yes Tomi Bamberger, PA-C  ibuprofen (ADVIL) 600 MG tablet Take 1 tablet (600 mg total) by mouth every 6 (six) hours as needed. 12/22/21   Gerald Leitz, MD  Prenatal Vit-Fe Fumarate-FA (PRENATAL MULTIVITAMIN) TABS tablet Take 1  tablet by mouth daily at 12 noon.    [provider]    Family History History reviewed. No pertinent family history.  Social History Social History   Tobacco Use   Smoking status: Never   Smokeless tobacco: Never  Vaping Use   Vaping status: Never Used  Substance Use Topics   Alcohol use: No   Drug use: No     Allergies   Patient has no known allergies.   Review of Systems Review of Systems  Constitutional:  Positive for fever.  HENT:  Positive for congestion. Negative for ear pain and sore throat.   Eyes:  Negative for discharge and redness.  Respiratory:  Positive for cough. Negative for shortness of breath and wheezing.   Gastrointestinal:  Negative for abdominal pain, diarrhea, nausea and vomiting.  Musculoskeletal:  Positive for myalgias.     Physical Exam Triage Vital Signs ED Triage Vitals  Encounter Vitals Group     BP 09/30/23 1731 108/68     Systolic BP Percentile --      Diastolic  BP Percentile --      Pulse Rate 09/30/23 1731 (!) 125     Resp 09/30/23 1731 (!) 26     Temp 09/30/23 1731 (!) 100.4 F (38 C)     Temp Source 09/30/23 1731 Oral     SpO2 09/30/23 1731 94 %     Weight 09/30/23 1730 240 lb (108.9 kg)     Height 09/30/23 1730 5\' 4"  (1.626 m)     Head Circumference --      Peak Flow --      Pain Score 09/30/23 1728 0     Pain Loc --      Pain Education --      Exclude from Growth Chart --    No data found.  Updated Vital Signs BP 108/68 (BP Location: Left Arm)   Pulse (!) 122   Temp (!) 100.4 F (38 C) (Oral)   Resp (!) 22   Ht 5\' 4"  (1.626 m)   Wt 240 lb (108.9 kg)   LMP 09/27/2023 (Exact Date)   SpO2 95%   BMI 41.20 kg/m   Visual Acuity Right Eye Distance:   Left Eye Distance:   Bilateral Distance:    Right Eye Near:   Left Eye Near:    Bilateral Near:     Physical Exam Vitals and nursing note reviewed.  Constitutional:      General: She is not in acute distress.    Appearance: Normal appearance. She  is not ill-appearing.  HENT:     Head: Normocephalic and atraumatic.     Nose: Congestion present.     Mouth/Throat:     Mouth: Mucous membranes are moist.     Pharynx: No oropharyngeal exudate or posterior oropharyngeal erythema.  Eyes:     Conjunctiva/sclera: Conjunctivae normal.  Cardiovascular:     Rate and Rhythm: Normal rate and regular rhythm.     Heart sounds: Normal heart sounds. No murmur heard. Pulmonary:     Effort: Pulmonary effort is normal. No respiratory distress.     Breath sounds: Normal breath sounds. No wheezing, rhonchi or rales.  Skin:    General: Skin is warm and dry.  Neurological:     Mental Status: She is alert.  Psychiatric:        Mood and Affect: Mood normal.        Thought Content: Thought content normal.      UC Treatments / Results  Labs (all labs ordered are listed, but only abnormal results are displayed) Labs Reviewed  POC COVID19/FLU A&B COMBO - Abnormal; Notable for the following components:      Result Value   Influenza A Antigen, POC Positive (*)    All other components within normal limits    EKG   Radiology No results found.  Procedures Procedures (including critical care time)  Medications Ordered in UC Medications  acetaminophen (TYLENOL) tablet 650 mg (650 mg Oral Given 09/30/23 1738)    Initial Impression / Assessment and Plan / UC Course  I have reviewed the triage vital signs and the nursing notes.  Pertinent labs & imaging results that were available during my care of the patient were reviewed by me and considered in my medical decision making (see chart for details).    Flu screening positive in office.  Will treat with Tamiflu and encouraged symptomatic treatment and use of albuterol if needed otherwise.  Discussed that given pre-existing asthma patient is at risk for worse progression of influenza and advised  further evaluation in the emergency room if symptoms worsen in any way.  Patient expresses  understanding.  Final Clinical Impressions(s) / UC Diagnoses   Final diagnoses:  Influenza A  Uncomplicated asthma, unspecified asthma severity, unspecified whether persistent   Discharge Instructions   None    ED Prescriptions     Medication Sig Dispense Auth. Provider   oseltamivir (TAMIFLU) 75 MG capsule Take 1 capsule (75 mg total) by mouth every 12 (twelve) hours. 10 capsule Tomi Bamberger, PA-C      PDMP not reviewed this encounter.   Tomi Bamberger, PA-C 09/30/23 1804    Tomi Bamberger, PA-C 09/30/23 [id number]

## 2023-09-30 NOTE — ED Notes (Signed)
Verbal Order (Note): Provider unable to access a computer creating unreasonable inconvenience to the ordering provider with possible delay in patient care. Acknowledged by Arlys John & Provider. Repeated Verbal order by Arlys John Provider.

## 2023-09-30 NOTE — ED Triage Notes (Signed)
"  This started with Cough that started last night, about an hour ago my chest was tight due to my Asthma, now my body aches, ? Fever".

## 2024-05-17 ENCOUNTER — Encounter: Payer: Self-pay | Admitting: Emergency Medicine

## 2024-05-17 ENCOUNTER — Ambulatory Visit: Admission: EM | Admit: 2024-05-17 | Discharge: 2024-05-17 | Disposition: A | Discharge status: Home / Self Care

## 2024-05-17 DIAGNOSIS — L989 Disorder of the skin and subcutaneous tissue, unspecified: Secondary | ICD-10-CM

## 2024-05-17 NOTE — ED Triage Notes (Signed)
 Pt st's she noticed several hives on her arms and legs 4 days ago  Pt denies any pain or itching

## 2024-05-17 NOTE — ED Provider Notes (Signed)
 [facility]    CSN: [id number] Arrival date & time: 05/17/24  0805      History   Chief Complaint Chief Complaint  Patient presents with   Rash    HPI Barbara King is a 28 y.o. female.   Discussed the use of AI scribe software for clinical note transcription with the patient, who gave verbal consent to proceed.   The patient presents with concerns about small, non-itchy bumps that appeared on their skin starting 3 days ago. The patient reports noticing little highs on their skin. They describe the bumps as not following any particular pattern and resembling bites, though they are unsure of the cause. The patient states that the bumps do not hurt or itch. They have noticed that the bumps appear and disappear randomly, with some previously present on their thigh and leg that have since resolved. The patient initially thought they might be mosquito bites but notes they haven't been outside recently. They mention that it started while at work  but cannot identify any specific exposures there. The patient denies any associated symptoms such as fever, headache, or neck pain. They also report that neither their son nor fianc has similar symptoms. The patient's mother, who has an autoimmune disease, encouraged her to seek medical evaluation. The patient acknowledges that they suspected it was probably nothing serious but came in at their mother's insistence.  The following sections of the patient's history were reviewed and updated as appropriate: allergies, current medications, past family history, past medical history, past social history, past surgical history, and problem list.     Past Medical History:  Diagnosis Date   Asthma     Patient Active Problem List   Diagnosis Date Noted   Normal labor 12/20/2021    Past Surgical History:  Procedure Laterality Date   HERNIA REPAIR      OB History     Gravida  1   Para      Term      Preterm      AB       Living         SAB      IAB      Ectopic      Multiple      Live Births               Home Medications    Prior to Admission medications   Not on File    Family History No family history on file.  Social History Social History   Tobacco Use   Smoking status: Never   Smokeless tobacco: Never  Vaping Use   Vaping status: Never Used  Substance Use Topics   Alcohol use: No   Drug use: No     Allergies   Patient has no known allergies.   Review of Systems Review of Systems  Constitutional:  Negative for fatigue and fever.  Musculoskeletal:  Negative for myalgias and neck pain.  Skin:  Positive for rash.  All other systems reviewed and are negative.    Physical Exam Triage Vital Signs ED Triage Vitals  Encounter Vitals Group     BP 05/17/24 0851 98/76     Girls Systolic BP Percentile --      Girls Diastolic BP Percentile --      Boys Systolic BP Percentile --      Boys Diastolic BP Percentile --      Pulse Rate 05/17/24 0851 83  Resp 05/17/24 0851 18     Temp 05/17/24 0851 97.8 F (36.6 C)     Temp Source 05/17/24 0851 Oral     SpO2 05/17/24 0851 98 %     Weight --      Height --      Head Circumference --      Peak Flow --      Pain Score 05/17/24 0854 0     Pain Loc --      Pain Education --      Exclude from Growth Chart --    No data found.  Updated Vital Signs BP 98/76 (BP Location: Left Arm)   Pulse 83   Temp 97.8 F (36.6 C) (Oral)   Resp 18   LMP 05/03/2024 (Exact Date)   SpO2 98%   Visual Acuity Right Eye Distance:   Left Eye Distance:   Bilateral Distance:    Right Eye Near:   Left Eye Near:    Bilateral Near:     Physical Exam Vitals reviewed.  Constitutional:      General: She is awake. She is not in acute distress.    Appearance: Normal appearance. She is well-developed. She is not ill-appearing, toxic-appearing or diaphoretic.  HENT:     Head: Normocephalic.     Right Ear: Hearing normal.     Left  Ear: Hearing normal.     Nose: Nose normal.     Mouth/Throat:     Mouth: Mucous membranes are moist.  Eyes:     General: Vision grossly intact.     Conjunctiva/sclera: Conjunctivae normal.  Cardiovascular:     Rate and Rhythm: Normal rate and regular rhythm.     Heart sounds: Normal heart sounds.  Pulmonary:     Effort: Pulmonary effort is normal.     Breath sounds: Normal breath sounds and air entry.  Musculoskeletal:        General: Normal range of motion.     Cervical back: Normal range of motion and neck supple.  Skin:    General: Skin is warm and dry.     Findings: Lesion present.     Comments: A small, rounded, slightly raised erythematous lesion is present on the right lower arm, without associated swelling, warmth, drainage, open areas, tenderness, or underlying fluctuance. A second, much smaller lesion is noted on the left lower arm, which is elevated but less erythematous.   Neurological:     General: No focal deficit present.     Mental Status: She is alert and oriented to person, place, and time.  Psychiatric:        Speech: Speech normal.        Behavior: Behavior is cooperative.         UC Treatments / Results  Labs (all labs ordered are listed, but only abnormal results are displayed) Labs Reviewed - No data to display  EKG   Radiology No results found.  Procedures Procedures (including critical care time)  Medications Ordered in UC Medications - No data to display  Initial Impression / Assessment and Plan / UC Course  I have reviewed the triage vital signs and the nursing notes.  Pertinent labs & imaging results that were available during my care of the patient were reviewed by me and considered in my medical decision making (see chart for details).     The patient presents with intermittent, non-itchy skin lesions that began three days ago. The lesions are described as small bumps occurring  randomly on the arms, thigh, and leg, appearing and  disappearing without pattern. At present, there are two lesions on the left and right lower arms. The lesions are not painful, pruritic, or uncomfortable, and the patient denies associated systemic symptoms such as fever, headache, or neck pain. There is no history of recent outdoor exposure or insect bites. Examination revealed lesions without signs of infection or inflammation. Given the benign presentation and absence of concerning features, a watchful waiting approach was recommended. No medications or diagnostic testing were ordered at this time. The patient and parent were advised on monitoring for changes, including fever, chills, body aches, pain, itching, increased number or size of lesions, or spreading redness, and to follow up with the primary care provider if symptoms worsen or persist. Emergency evaluation was advised if systemic symptoms develop or if lesions rapidly progress.  Today's evaluation has revealed no signs of a dangerous process. Discussed diagnosis with patient and/or guardian. Patient and/or guardian aware of their diagnosis, possible red flag symptoms to watch out for and need for close follow up. Patient and/or guardian understands verbal and written discharge instructions. Patient and/or guardian comfortable with plan and disposition.  Patient and/or guardian has a clear mental status at this time, good insight into illness (after discussion and teaching) and has clear judgment to make decisions regarding their care  Documentation was completed with the aid of voice recognition software. Transcription may contain typographical errors.  Final Clinical Impressions(s) / UC Diagnoses   Final diagnoses:  Benign skin lesion     Discharge Instructions      You were seen today for small skin bumps that have appeared and disappeared over the past few days. On exam, the bumps look harmless. They are not red, painful, or itchy, and there are no signs of infection. At this time,  no medicine or tests are needed. Please continue to monitor your skin. If the bumps increase in number, become larger, red, painful, or itchy, or if you develop new symptoms such as fever, chills, or body aches, contact your primary care provider for re-evaluation. If you notice sudden worsening, rapid spreading of the rash, or if you feel very ill, go to the emergency room. It may help to pay attention to any new exposures such as soaps, lotions, detergents, or recent outdoor activities, as these can sometimes trigger skin changes. For now, no specific treatment is required. You may wash your skin as usual with mild soap and water.     ED Prescriptions   None    PDMP not reviewed this encounter.   Iola Lukes, [place] 05/17/24 1121

## 2024-05-17 NOTE — Discharge Instructions (Addendum)
 You were seen today for small skin bumps that have appeared and disappeared over the past few days. On exam, the bumps look harmless. They are not red, painful, or itchy, and there are no signs of infection. At this time, no medicine or tests are needed. Please continue to monitor your skin. If the bumps increase in number, become larger, red, painful, or itchy, or if you develop new symptoms such as fever, chills, or body aches, contact your primary care provider for re-evaluation. If you notice sudden worsening, rapid spreading of the rash, or if you feel very ill, go to the emergency room. It may help to pay attention to any new exposures such as soaps, lotions, detergents, or recent outdoor activities, as these can sometimes trigger skin changes. For now, no specific treatment is required. You may wash your skin as usual with mild soap and water.
